# Patient Record
Sex: Female | Born: 1940 | Race: White | Hispanic: No | Marital: Married | Smoking: Never smoker
Health system: Southern US, Community
[De-identification: ages and names within clinical notes are randomized; demographics above are authoritative.]

## PROBLEM LIST (undated history)

## (undated) DIAGNOSIS — F0390 Unspecified dementia without behavioral disturbance: Secondary | ICD-10-CM

## (undated) DIAGNOSIS — K449 Diaphragmatic hernia without obstruction or gangrene: Secondary | ICD-10-CM

## (undated) DIAGNOSIS — D649 Anemia, unspecified: Secondary | ICD-10-CM

## (undated) DIAGNOSIS — E079 Disorder of thyroid, unspecified: Secondary | ICD-10-CM

## (undated) DIAGNOSIS — IMO0001 Reserved for inherently not codable concepts without codable children: Secondary | ICD-10-CM

## (undated) DIAGNOSIS — K219 Gastro-esophageal reflux disease without esophagitis: Secondary | ICD-10-CM

## (undated) DIAGNOSIS — M069 Rheumatoid arthritis, unspecified: Secondary | ICD-10-CM

## (undated) DIAGNOSIS — E785 Hyperlipidemia, unspecified: Secondary | ICD-10-CM

## (undated) DIAGNOSIS — R945 Abnormal results of liver function studies: Secondary | ICD-10-CM

## (undated) DIAGNOSIS — Z79899 Other long term (current) drug therapy: Secondary | ICD-10-CM

## (undated) DIAGNOSIS — N179 Acute kidney failure, unspecified: Secondary | ICD-10-CM

## (undated) DIAGNOSIS — I10 Essential (primary) hypertension: Secondary | ICD-10-CM

## (undated) DIAGNOSIS — R7989 Other specified abnormal findings of blood chemistry: Secondary | ICD-10-CM

## (undated) DIAGNOSIS — K529 Noninfective gastroenteritis and colitis, unspecified: Secondary | ICD-10-CM

## (undated) DIAGNOSIS — M1990 Unspecified osteoarthritis, unspecified site: Secondary | ICD-10-CM

## (undated) DIAGNOSIS — K2980 Duodenitis without bleeding: Secondary | ICD-10-CM

## (undated) DIAGNOSIS — Z299 Encounter for prophylactic measures, unspecified: Secondary | ICD-10-CM

## (undated) DIAGNOSIS — E119 Type 2 diabetes mellitus without complications: Secondary | ICD-10-CM

## (undated) DIAGNOSIS — K222 Esophageal obstruction: Secondary | ICD-10-CM

## (undated) DIAGNOSIS — R413 Other amnesia: Secondary | ICD-10-CM

## (undated) HISTORY — DX: Reserved for inherently not codable concepts without codable children: IMO0001

## (undated) HISTORY — DX: Disorder of thyroid, unspecified: E07.9

## (undated) HISTORY — DX: Esophageal obstruction: K22.2

## (undated) HISTORY — PX: DG GALL BLADDER: HXRAD326

## (undated) HISTORY — DX: Essential (primary) hypertension: I10

## (undated) HISTORY — DX: Hyperlipidemia, unspecified: E78.5

## (undated) HISTORY — DX: Other long term (current) drug therapy: Z79.899

## (undated) HISTORY — DX: Encounter for prophylactic measures, unspecified: Z29.9

## (undated) HISTORY — DX: Type 2 diabetes mellitus without complications: E11.9

## (undated) HISTORY — DX: Duodenitis without bleeding: K29.80

## (undated) HISTORY — DX: Unspecified osteoarthritis, unspecified site: M19.90

## (undated) HISTORY — DX: Diaphragmatic hernia without obstruction or gangrene: K44.9

## (undated) HISTORY — DX: Acute kidney failure, unspecified: N17.9

## (undated) HISTORY — DX: Gastro-esophageal reflux disease without esophagitis: K21.9

---

## 1999-02-19 ENCOUNTER — Encounter (INDEPENDENT_AMBULATORY_CARE_PROVIDER_SITE_OTHER): Payer: Self-pay | Admitting: Specialist

## 1999-02-19 ENCOUNTER — Ambulatory Visit (HOSPITAL_COMMUNITY): Admission: RE | Admit: 1999-02-19 | Discharge: 1999-02-19 | Payer: Self-pay | Admitting: *Deleted

## 1999-11-18 ENCOUNTER — Encounter: Payer: Self-pay | Admitting: Family Medicine

## 1999-11-18 ENCOUNTER — Encounter: Admission: RE | Admit: 1999-11-18 | Discharge: 1999-11-18 | Payer: Self-pay | Admitting: Family Medicine

## 1999-11-23 ENCOUNTER — Ambulatory Visit (HOSPITAL_COMMUNITY): Admission: RE | Admit: 1999-11-23 | Discharge: 1999-11-23 | Payer: Self-pay

## 2000-11-20 ENCOUNTER — Encounter: Admission: RE | Admit: 2000-11-20 | Discharge: 2000-11-20 | Payer: Self-pay | Admitting: Family Medicine

## 2000-11-20 ENCOUNTER — Encounter: Payer: Self-pay | Admitting: Family Medicine

## 2001-11-02 DIAGNOSIS — E042 Nontoxic multinodular goiter: Secondary | ICD-10-CM

## 2001-11-02 HISTORY — DX: Nontoxic multinodular goiter: E04.2

## 2001-11-27 ENCOUNTER — Encounter: Payer: Self-pay | Admitting: Family Medicine

## 2001-11-27 ENCOUNTER — Encounter: Admission: RE | Admit: 2001-11-27 | Discharge: 2001-11-27 | Payer: Self-pay | Admitting: Family Medicine

## 2001-11-29 ENCOUNTER — Encounter: Payer: Self-pay | Admitting: Endocrinology

## 2001-11-29 ENCOUNTER — Ambulatory Visit (HOSPITAL_COMMUNITY): Admission: RE | Admit: 2001-11-29 | Discharge: 2001-11-29 | Payer: Self-pay | Admitting: Endocrinology

## 2002-11-29 ENCOUNTER — Encounter: Payer: Self-pay | Admitting: Family Medicine

## 2002-11-29 ENCOUNTER — Encounter: Admission: RE | Admit: 2002-11-29 | Discharge: 2002-11-29 | Payer: Self-pay | Admitting: Family Medicine

## 2004-09-15 ENCOUNTER — Encounter: Admission: RE | Admit: 2004-09-15 | Discharge: 2004-09-15 | Payer: Self-pay | Admitting: Family Medicine

## 2006-01-30 ENCOUNTER — Encounter (INDEPENDENT_AMBULATORY_CARE_PROVIDER_SITE_OTHER): Payer: Self-pay | Admitting: Specialist

## 2006-01-30 ENCOUNTER — Ambulatory Visit (HOSPITAL_COMMUNITY): Admission: RE | Admit: 2006-01-30 | Discharge: 2006-01-30 | Payer: Self-pay | Admitting: *Deleted

## 2007-04-09 ENCOUNTER — Encounter: Admission: RE | Admit: 2007-04-09 | Discharge: 2007-04-09 | Payer: Self-pay | Admitting: Family

## 2007-07-27 ENCOUNTER — Encounter: Admission: RE | Admit: 2007-07-27 | Discharge: 2007-07-27 | Payer: Self-pay

## 2010-08-20 NOTE — Op Note (Signed)
Alejandra Espinoza, Alejandra Espinoza                ACCOUNT NO.:  [id number]   MEDICAL RECORD NO.:  [id number]          PATIENT TYPE:  AMB   LOCATION:  ENDO                         FACILITY:  [facility]   PHYSICIAN:  Georgiana Spinner, M.D.    DATE OF BIRTH:  August 02, 1940   DATE OF PROCEDURE:  01/30/2006  DATE OF DISCHARGE:                                 OPERATIVE REPORT   PROCEDURE:  Upper endoscopy   ANESTHESIA:  Demerol 60 mg, Versed 7.5 mg.   PROCEDURE:  With the patient mildly sedated in the left lateral decubitus  position the Olympus videoscopic endoscope was inserted in the mouth and  passed under direct vision through the esophagus which appeared normal until  we reached distal esophagus and there appeared to be a rim of erythematous  change at the squamocolumnar junction that was photographed and biopsied.  We entered into the stomach and blood was seen in the stomach.  This was  photographed and the erythematous changes of the gastric fundus, body, and  antrum were also noted, photographed, and biopsied. Duodenal bulb, second  portion duodenum appeared normal.  From this point the endoscope was slowly  withdrawn taking circumferential views of duodenal mucosa until the  endoscope had been pulled back into the stomach and placed in retroflexion  to view the stomach from below. The endoscope was then straightened and  withdrawn taking circumferential views remaining gastric and esophageal  mucosa.  The patient's vital signs, pulse oximeter remained stable.  The  patient tolerated procedure well without apparent complications.   FINDINGS:  Diffuse erythematous gastritis changes involving the entire  stomach and changes of erythema of the squamocolumnar junction.  Await  biopsy report.  The patient will call me for results and follow-up with me  as an outpatient.  Proceed to colonoscopy as planned.           ______________________________  Georgiana Spinner, M.D.     GMO/MEDQ  D:  01/30/2006  T:   01/31/2006  Job:  [id number]

## 2010-08-20 NOTE — Op Note (Signed)
Alejandra Espinoza, Alejandra Espinoza                ACCOUNT NO.:  [id number]   MEDICAL RECORD NO.:  [id number]          PATIENT TYPE:  AMB   LOCATION:  ENDO                         FACILITY:  [facility]   PHYSICIAN:  Georgiana Spinner, M.D.    DATE OF BIRTH:  Aug 12, 1940   DATE OF PROCEDURE:  01/30/2006  DATE OF DISCHARGE:                                 OPERATIVE REPORT   PROCEDURE:  Colonoscopy.   INDICATIONS:  Hemoccult positivity.   ANESTHESIA:  Demerol 40, Versed 2.5 mg.   PROCEDURE:  With the patient mildly sedated in the left lateral decubitus  position the Olympus videoscopic colonoscope was inserted into the rectum  and passed under direct vision to the cecum identified by ileocecal valve  and appendiceal orifice both of which were photographed. From this point the  colonoscope was slowly withdrawn taking circumferential views of colonic  mucosa stopping only in the rectum which appeared normal on direct and  showed hemorrhoids on retroflexed view. The endoscope was straightened,  withdrawn.  The patient's vital signs, pulse oximeter remained stable.  The  patient tolerated procedure well without apparent complications.   FINDINGS:  Internal hemorrhoids otherwise unremarkable colonoscopic  examination to cecum.   PLAN:  See endoscopy note for further details.           ______________________________  Georgiana Spinner, M.D.     GMO/MEDQ  D:  01/30/2006  T:  01/31/2006  Job:  [id number]

## 2011-06-23 ENCOUNTER — Other Ambulatory Visit: Payer: Self-pay | Admitting: Gastroenterology

## 2011-06-29 ENCOUNTER — Ambulatory Visit
Admission: RE | Admit: 2011-06-29 | Discharge: 2011-06-29 | Disposition: A | Discharge status: Home / Self Care | Payer: Medicare Other | Source: Ambulatory Visit | Attending: Gastroenterology | Admitting: Gastroenterology

## 2013-05-20 ENCOUNTER — Encounter: Payer: Self-pay | Admitting: Podiatry

## 2013-05-20 ENCOUNTER — Ambulatory Visit (INDEPENDENT_AMBULATORY_CARE_PROVIDER_SITE_OTHER): Payer: Medicare Other | Admitting: Podiatry

## 2013-05-20 VITALS — BP 140/77 | HR 69 | Resp 18

## 2013-05-20 DIAGNOSIS — E1149 Type 2 diabetes mellitus with other diabetic neurological complication: Secondary | ICD-10-CM

## 2013-05-20 DIAGNOSIS — M79609 Pain in unspecified limb: Secondary | ICD-10-CM

## 2013-05-20 MED ORDER — GABAPENTIN 300 MG PO CAPS: 300.0000 mg | ORAL_CAPSULE | Freq: Every day | ORAL | Status: DC

## 2013-05-20 NOTE — Patient Instructions (Signed)
Diabetic Neuropathy Diabetic neuropathy is a nerve disease or nerve damage that is caused by diabetes mellitus. About half of all people with diabetes mellitus have some form of nerve damage. Nerve damage is more common in those who have had diabetes mellitus for many years and who generally have not had good control of their blood sugar (glucose) level. Diabetic neuropathy is a common complication of diabetes mellitus. There are three more common types of diabetic neuropathy and a fourth type that is less common and less understood:   Peripheral neuropathy This is the most common type of diabetic neuropathy. It causes damage to the nerves of the feet and legs first and then eventually the hands and arms.The damage affects the ability to sense touch.  Autonomic neuropathy This type causes damage to the autonomic nervous system, which controls the following functions:  Heartbeat.  Body temperature.  Blood pressure.  Urination.  Digestion.  Sweating.  Sexual function.  Focal neuropathy Focal neuropathy can be painful and unpredictable and occurs most often in older adults with diabetes mellitus. It involves a specific nerve or one area and often comes on suddenly. It usually does not cause long-term problems.  Radiculoplexus neuropathy Sometimes called lumbosacral radiculoplexus neuropathy, radiculoplexus neuropathy affects the nerves of the thighs, hips, buttocks, or legs. It is more common in people with type 2 diabetes mellitus and in older men. It is characterized by debilitating pain, weakness, and atrophy, usually in the thigh muscles. CAUSES  The cause of peripheral, autonomic, and focal neuropathies is diabetes mellitus that is uncontrolled and high glucose levels. The cause of radiculoplexus neuropathy is unknown. However, it is thought to be caused by inflammation related to uncontrolled glucose levels. SIGNS AND SYMPTOMS  Peripheral Neuropathy Peripheral neuropathy develops  slowly over time. When the nerves of the feet and legs no longer work there may be:   Burning, stabbing, or aching pain in the legs or feet.  Inability to feel pressure or pain in your feet. This can lead to:  Thick calluses over pressure areas.  Pressure sores.  Ulcers.  Foot deformities.  Reduced ability to feel temperature changes.  Muscle weakness. Autonomic Neuropathy The symptoms of autonomic neuropathy vary depending on which nerves are affected. Symptoms may include:  Problems with digestion, such as:  Feeling sick to your stomach (nausea).  Vomiting.  Bloating.  Constipation.  Diarrhea.  Abdominal pain.  Difficulty with urination. This occurs if you lose your ability to sense when your bladder is full. Problems include:  Urine leakage (incontinence).  Inability to empty your bladder completely (retention).  Rapid or irregular heartbeat (palpitations).  Blood pressure drops when you stand up (orthostatic hypotension). When you stand up you may feel:  Dizzy.  Weak.  Faint.  In men, inability to attain and maintain an erection.  In women, vaginal dryness and problems with decreased sexual desire and arousal.  Problems with body temperature regulation.  Increased or decreased sweating. Focal Neuropathy  Abnormal eye movements or abnormal alignment of both eyes.  Weakness in the wrist.  Foot drop. This results in an inability to lift the foot properly and abnormal walking or foot movement.  Paralysis on one side of your face (Bell palsy).  Chest or abdominal pain. Radiculoplexus Neuropathy  Sudden, severe pain in your hip, thigh, or buttocks.  Weakness and wasting of thigh muscles.  Difficulty rising from a seated position.  Abdominal swelling.  Unexplained weight loss (usually more than 10 lb [4.5 kg]). DIAGNOSIS  Peripheral Neuropathy   Your senses may be tested. Sensory function testing can be done with:  A light touch using a  monofilament.  A vibration with tuning fork.  A sharp sensation with a pin prick. Other tests that can help diagnose neuropathy are:  Nerve conduction velocity. This test checks the transmission of an electrical current through a nerve.  Electromyography. This shows how muscles respond to electrical signals transmitted by nearby nerves.  Quantitative sensory testing. This is used to assess how your nerves respond to vibrations and changes in temperature. Autonomic Neuropathy Diagnosis is often based on reported symptoms. Tell your health care provider if you experience:   Dizziness.   Constipation.   Diarrhea.   Inappropriate urination or inability to urinate.   Inability to get or maintain an erection.  Tests that may be done include:   Electrocardiography or Holter monitor. These are tests that can help show problems with the heart rate or heart rhythm.   An X-ray exam may be done. Focal Neuropathy Diagnosis is made based on your symptoms and what your health care provider finds during your exam. Other tests may be done. They may include:  Nerve conduction velocities. This checks the transmission of electrical current through a nerve.  Electromyography. This shows how muscles respond to electrical signals transmitted by nearby nerves.  Quantitative sensory testing. This test is used to assess how your nerves respond to vibration and changes in temperature. Radiculoplexus Neuropathy  Often the first thing is to eliminate any other issue or problems that might be the cause, as there is no stick test for diagnosis.  X-ray exam of your spine and lumbar region.  Spinal tap to rule out cancer.  MRI to rule out other lesions. TREATMENT  Once nerve damage occurs, it cannot be reversed. The goal of treatment is to keep the disease or nerve damage from getting worse and affecting more nerve fibers. Controlling your blood glucose level is the key. Most people with  radiculoplexus neuropathy see at least a partial improvement over time. You will need to keep your blood glucose and HbA1c levels in the target range determined by your health care provider. Things that help control blood glucose levels include:   Blood glucose monitoring.   Meal planning.   Physical activity.   Diabetes medicine.  Over time, maintaining lower blood glucose levels helps lessen symptoms. Sometimes, prescription pain medicine is needed. HOME CARE INSTRUCTIONS:  Do not smoke.  Keep your blood glucose level in the range that you and your health care provider have determined acceptable for you.  Keep your blood pressure level in the range that you and your health care provider have determined acceptable for you.  Eat a well-balanced diet.  Be active every day.  Check your feet every day. SEEK MEDICAL CARE IF:   You have burning, stabbing, or aching pain in the legs or feet.  You are unable to feel pressure or pain in your feet.  You develop problems with digestion such as:  Nausea.  Vomiting.  Bloating.  Constipation.  Diarrhea.  Abdominal pain.  You have difficulty with urination, such as:  Incontinence.  Retention.  You have palpitations.  You develop orthostatic hypotension. When you stand up you may feel:  Dizzy.  Weak.  Faint.  You cannot attain and maintain an erection (in men).  You have vaginal dryness and problems with decreased sexual desire and arousal (in women).  You have severe pain in your thighs, legs, or buttocks.  You have   unexplained weight loss. Document Released: 05/30/2001 Document Revised: 01/09/2013 Document Reviewed: 08/30/2012 [employer] Patient Information 2014 [employer].  

## 2013-05-20 NOTE — Progress Notes (Signed)
   Subjective:    Patient ID: Alejandra BaizeLinda B Espinoza, female    DOB: 1941/02/11, 73 y.o.   MRN: [id number]  HPI both my feet sting and get numbness and sore and tender and I am diabetic and have been for about 15 years or longer and some swelling and right foot is the worse. Symptoms have been present for approximately 5 years and worsening over the last 2 years. She describes difficulty sleeping at night because of unpleasant feelings in her feet.    Review of Systems  HENT: Positive for sinus pressure.   Cardiovascular:       Calf pain with walking  Gastrointestinal: Positive for constipation.  Endocrine: Positive for cold intolerance.  Musculoskeletal: Positive for gait problem.       Joint and muscle pain  Neurological: Positive for numbness.  Hematological: Bruises/bleeds easily.       Slow to heal  Psychiatric/Behavioral: The patient is nervous/anxious.   All other systems reviewed and are negative.       Objective:   Physical Exam  Orientated x3 white female  Vascular: The DP and PT pulses are 2/4 bilaterally  Neurological: Sensation detained in monofilament wire intact 10/10 bilaterally. Needle and ankle reflexes are reactive bilaterally. Vibratory sensation diminished mildly on left.  Dermatological: atrophic skin without any hair growth noted. Elongated incurvated toenails x10  Musculoskeletal: Left HAV. No restriction ankle subtalar midtarsal joints.        Assessment & Plan:   Assessment: Diabetic peripheral neuropathy with associated pain.  Plan: Rx Neurontin 300 mg #30 one at bedtime. Reevaluate x30 days

## 2013-06-17 ENCOUNTER — Ambulatory Visit (INDEPENDENT_AMBULATORY_CARE_PROVIDER_SITE_OTHER): Payer: BC Managed Care – PPO | Admitting: Podiatry

## 2013-06-17 ENCOUNTER — Encounter: Payer: Self-pay | Admitting: Podiatry

## 2013-06-17 VITALS — BP 145/70 | HR 77 | Resp 18

## 2013-06-17 DIAGNOSIS — M79609 Pain in unspecified limb: Secondary | ICD-10-CM

## 2013-06-17 DIAGNOSIS — E1149 Type 2 diabetes mellitus with other diabetic neurological complication: Secondary | ICD-10-CM

## 2013-06-17 MED ORDER — GABAPENTIN 300 MG PO CAPS: 300.0000 mg | ORAL_CAPSULE | Freq: Every day | ORAL | Status: DC

## 2013-06-17 MED ORDER — GABAPENTIN 300 MG PO CAPS: 300.0000 mg | ORAL_CAPSULE | Freq: Two times a day (BID) | ORAL | Status: DC

## 2013-06-17 NOTE — Patient Instructions (Signed)
Take 1 gabapentin twice a day. Recheck x30 days

## 2013-06-18 NOTE — Progress Notes (Signed)
Patient ID: Alejandra BaizeLinda B Watling, female   DOB: 1940-04-11, 73 y.o.   MRN: [id number] Subjective: Patient presents for followup visit of 05/20/2013 and has been taking gabapentin 300 mg at bedtime she's describes some mild relief from her painful feet however she still quite uncomfortable. She denies any side effects from the medication including edema rashes.  Objective orientated [id number] year old white female patient  Assessment persistence of diabetic peripheral neuropathy bilaterally Satisfactory tolerance of low-dose gabapentin  Plan: Increase gabapentin 300 mg 2 twice a day x30 days. Reappoint x30 days

## 2013-07-15 ENCOUNTER — Ambulatory Visit: Payer: Medicare Other | Admitting: Podiatry

## 2014-02-03 ENCOUNTER — Encounter: Payer: Self-pay | Admitting: Podiatry

## 2014-02-03 ENCOUNTER — Ambulatory Visit (INDEPENDENT_AMBULATORY_CARE_PROVIDER_SITE_OTHER): Payer: Medicare Other | Admitting: Podiatry

## 2014-02-03 VITALS — BP 159/79 | HR 67 | Temp 97.6°F | Resp 16 | Ht 65.0 in | Wt 189.0 lb

## 2014-02-03 DIAGNOSIS — E11621 Type 2 diabetes mellitus with foot ulcer: Secondary | ICD-10-CM

## 2014-02-03 DIAGNOSIS — L97409 Non-pressure chronic ulcer of unspecified heel and midfoot with unspecified severity: Secondary | ICD-10-CM

## 2014-02-03 MED ORDER — SILVER SULFADIAZINE 1 % EX CREA: 1.0000 "application " | TOPICAL_CREAM | Freq: Every day | CUTANEOUS | Status: DC

## 2014-02-03 MED ORDER — CEPHALEXIN 500 MG PO CAPS: 500.0000 mg | ORAL_CAPSULE | Freq: Four times a day (QID) | ORAL | Status: DC

## 2014-02-03 NOTE — Progress Notes (Signed)
   Subjective:    Patient ID: Alejandra BaizeLinda B Espinoza, female    DOB: [birth year], 73 y.o.   MRN: [id number]  HPI Comments: N diabetic ulcers L B/L posterior heels, right > left D on and off for 2 - 3 month C scabbing and peeling A diabetes, pt states she either sits or lies down  T Neosporin only      Review of Systems  Musculoskeletal:       Fractured tailbone 3 weeks ago.  All other systems reviewed and are negative.      Objective:   Physical Exam  Orientated 3  Vascular: DP pulses 2/4 bilaterally PT pulses 2/4 bilaterally  Neurological: Sensation to 10 g monofilament wire intact 5/5 bilaterally Vibratory sensation intact bilaterally Ankle reflexes equal and reactive bilaterally  Dermatological: Posterior left heel as a dry eschar 3 cm in diameter. No erythema, edema or drainage noted surrounding this area.  Posterior lateral right heel has a  Eschar 2.5 cm x 1 cm with the central portion having a superficial ulcer with a granular base with low-grade erythema without active drainage or increased warmth or malodor      Assessment & Plan:   Assessment: Superficial ulceration right lateral heel with low-grade cellulitis  Stable eschar left heel without any clinical sign of infection  Patient is is sedentary and sits with her legs in a externally rotated position on a continuous basis placing excessive weight on the  heel areas  Plan: The superficial ulcer in the right heel was debrided and dressed with Silvadene Cephalexin 500 mg 4 times a day 10 days prescribed  Patient advised to apply Silvadene daily to the posterior right heel and cover with gauze Leg cradles recommended bilaterally  Reappoint 7 days

## 2014-02-03 NOTE — Patient Instructions (Addendum)
Either use pillows behind your legs or leg cradles to prevent pressure on your right and left heels  Gave  patient a prescription for leg cradles [facility] Apply Silvadene cream daily to the skin ulcer in your right heel and cover with gauze Begin oral antibiotics cephalexin 500 mg 1 capsule 4 times a day

## 2014-02-04 ENCOUNTER — Other Ambulatory Visit: Payer: Self-pay | Admitting: Podiatry

## 2014-02-04 ENCOUNTER — Encounter: Payer: Self-pay | Admitting: Podiatry

## 2014-02-06 ENCOUNTER — Ambulatory Visit: Payer: Medicare Other | Admitting: Podiatry

## 2014-02-10 ENCOUNTER — Encounter: Payer: Self-pay | Admitting: Podiatry

## 2014-02-10 ENCOUNTER — Ambulatory Visit (INDEPENDENT_AMBULATORY_CARE_PROVIDER_SITE_OTHER): Payer: BC Managed Care – PPO | Admitting: Podiatry

## 2014-02-10 VITALS — BP 128/71 | HR 75 | Temp 98.0°F | Resp 14

## 2014-02-10 DIAGNOSIS — L97409 Non-pressure chronic ulcer of unspecified heel and midfoot with unspecified severity: Secondary | ICD-10-CM

## 2014-02-10 DIAGNOSIS — E11621 Type 2 diabetes mellitus with foot ulcer: Secondary | ICD-10-CM

## 2014-02-10 NOTE — Patient Instructions (Signed)
Apply Silvadene cream to the skin ulcers on the right and left heels daily and cover with gauze Wear leg cradle right Place spell oh underneath left leg Limit standing walking

## 2014-02-11 NOTE — Progress Notes (Signed)
Patient ID: Alejandra BaizeLinda B Salls, female   DOB: [birth year], 73 y.o.   MRN: [id number]  Subjective: Patient presents for follow-up care after the visit of 02/03/2014 for bilateral posterior skin ulcers. Patient using a leg cradle on the right Currently taking cephalexin from visit of 02/03/2014 without a complaint  Objective: Eschar posterior right heel after debridement breaks down to 15 x 20 mm superficial ulcer with red granular base with slight serous drainage noted. There is no erythema, edema surrounding the wound site  Eschar posterior lateral left heel breaks down into superficial ulceration with granular base measuring 15 x 10 mm without any erythema, edema or drainage noted.  Assessment: Noninfected skin ulcer posterior lateral heels right and left  Plan: The eschar and posterior heel ulcers were debrided and dressed with Silvadene Patient will apply Silvadene and gauze dressing to ulcers daily Complete any remaining cephalexin Use pillow on left leg or purchase an additional leg cradle for left  Reappoint 7 days

## 2014-02-17 ENCOUNTER — Encounter: Payer: Self-pay | Admitting: Podiatry

## 2014-02-17 ENCOUNTER — Ambulatory Visit (INDEPENDENT_AMBULATORY_CARE_PROVIDER_SITE_OTHER): Payer: BC Managed Care – PPO | Admitting: Podiatry

## 2014-02-17 VITALS — BP 157/90 | HR 91 | Temp 97.6°F | Resp 13

## 2014-02-17 DIAGNOSIS — L97409 Non-pressure chronic ulcer of unspecified heel and midfoot with unspecified severity: Secondary | ICD-10-CM

## 2014-02-17 DIAGNOSIS — E11621 Type 2 diabetes mellitus with foot ulcer: Secondary | ICD-10-CM

## 2014-02-17 NOTE — Patient Instructions (Signed)
Continue to apply Silvadene cream and gauze dressings to the right and left heels daily. Attach gauze with paper tape  Use the leg cradles when you sleep at night

## 2014-02-18 NOTE — Progress Notes (Signed)
Patient ID: Alejandra BaizeLinda B Stark, female   DOB: [birth year], 73 y.o.   MRN: [id number]  Subjective: This patient presents for bilateral diabetic heel ulcers under our care since 02/03/2014. Patient has completed cephalexin without a complaint from medication. Patient is using leg cradle on the right only. She is confused as to whether to use the leg cradle when she sleeps and apparently does not use leg cradle when sleeping.  Objective: Right foot Lateral right heel is skin ulcer with eschar in granular base measuring 12 x 5 mm. There is no active erythema, edema surrounding the skin ulcer  Left foot Lateral left heel as eschar with 5 x 20 mm superficial ulcer with a moist fibrous base. No erythema, edema surrounds the ulcer site  Assessment: Noninfected skin ulcers bilaterally  Plan: Sharp debride plantar skin ulcer left patient's tolerance Sharp debride plantar skin ulcer right Apply Silvadene dressings right and left  Advised patient to use leg cradles when sitting and sleeping is much as possible Prescribed leg cradle for patient to wear on the right leg for [facility].  Patient will continue apply Silvadene cream and gauze dressings to heel ulcers right and left Advised to use leg cradles right and left including sleeping  Reappoint 7 days

## 2014-02-24 ENCOUNTER — Ambulatory Visit: Payer: BC Managed Care – PPO | Admitting: Podiatry

## 2014-06-04 ENCOUNTER — Emergency Department (HOSPITAL_COMMUNITY): Payer: Medicare Other

## 2014-06-04 ENCOUNTER — Observation Stay (HOSPITAL_COMMUNITY)
Admission: EM | Admit: 2014-06-04 | Discharge: 2014-06-06 | Disposition: A | Discharge status: Home / Self Care | Payer: Medicare Other | Attending: Internal Medicine | Admitting: Internal Medicine

## 2014-06-04 ENCOUNTER — Encounter (HOSPITAL_COMMUNITY): Payer: Self-pay

## 2014-06-04 DIAGNOSIS — I10 Essential (primary) hypertension: Secondary | ICD-10-CM | POA: Diagnosis not present

## 2014-06-04 DIAGNOSIS — R55 Syncope and collapse: Principal | ICD-10-CM | POA: Insufficient documentation

## 2014-06-04 DIAGNOSIS — W1839XA Other fall on same level, initial encounter: Secondary | ICD-10-CM | POA: Diagnosis not present

## 2014-06-04 DIAGNOSIS — R4182 Altered mental status, unspecified: Secondary | ICD-10-CM | POA: Diagnosis present

## 2014-06-04 DIAGNOSIS — Y929 Unspecified place or not applicable: Secondary | ICD-10-CM | POA: Diagnosis not present

## 2014-06-04 DIAGNOSIS — Y939 Activity, unspecified: Secondary | ICD-10-CM | POA: Insufficient documentation

## 2014-06-04 DIAGNOSIS — R569 Unspecified convulsions: Secondary | ICD-10-CM | POA: Diagnosis not present

## 2014-06-04 DIAGNOSIS — M1990 Unspecified osteoarthritis, unspecified site: Secondary | ICD-10-CM | POA: Insufficient documentation

## 2014-06-04 DIAGNOSIS — Y999 Unspecified external cause status: Secondary | ICD-10-CM | POA: Diagnosis not present

## 2014-06-04 DIAGNOSIS — E079 Disorder of thyroid, unspecified: Secondary | ICD-10-CM | POA: Insufficient documentation

## 2014-06-04 DIAGNOSIS — E119 Type 2 diabetes mellitus without complications: Secondary | ICD-10-CM | POA: Diagnosis not present

## 2014-06-04 DIAGNOSIS — Z792 Long term (current) use of antibiotics: Secondary | ICD-10-CM | POA: Insufficient documentation

## 2014-06-04 DIAGNOSIS — E785 Hyperlipidemia, unspecified: Secondary | ICD-10-CM | POA: Diagnosis not present

## 2014-06-04 DIAGNOSIS — N179 Acute kidney failure, unspecified: Secondary | ICD-10-CM | POA: Diagnosis present

## 2014-06-04 DIAGNOSIS — Z043 Encounter for examination and observation following other accident: Secondary | ICD-10-CM | POA: Diagnosis not present

## 2014-06-04 DIAGNOSIS — Z79899 Other long term (current) drug therapy: Secondary | ICD-10-CM | POA: Insufficient documentation

## 2014-06-04 LAB — URINALYSIS, ROUTINE W REFLEX MICROSCOPIC
BILIRUBIN URINE: NEGATIVE
Glucose, UA: 500 mg/dL — AB
Hgb urine dipstick: NEGATIVE
KETONES UR: NEGATIVE mg/dL
NITRITE: NEGATIVE
PROTEIN: NEGATIVE mg/dL
Specific Gravity, Urine: 1.013 (ref 1.005–1.030)
Urobilinogen, UA: 1 mg/dL (ref 0.0–1.0)
pH: 6 (ref 5.0–8.0)

## 2014-06-04 LAB — LACTIC ACID, PLASMA: Lactic Acid, Venous: 1.2 mmol/L (ref 0.5–2.0)

## 2014-06-04 LAB — TSH
TSH: 0.14 u[IU]/mL — ABNORMAL LOW (ref 0.350–4.500)
TSH: 0.151 u[IU]/mL — ABNORMAL LOW (ref 0.350–4.500)

## 2014-06-04 LAB — COMPREHENSIVE METABOLIC PANEL
ALBUMIN: 3.6 g/dL (ref 3.5–5.2)
ALT: 12 U/L (ref 0–35)
AST: 16 U/L (ref 0–37)
Alkaline Phosphatase: 54 U/L (ref 39–117)
Anion gap: 8 (ref 5–15)
BUN: 29 mg/dL — ABNORMAL HIGH (ref 6–23)
CALCIUM: 9.2 mg/dL (ref 8.4–10.5)
CO2: 27 mmol/L (ref 19–32)
Chloride: 100 mmol/L (ref 96–112)
Creatinine, Ser: 1.99 mg/dL — ABNORMAL HIGH (ref 0.50–1.10)
GFR, EST AFRICAN AMERICAN: 28 mL/min — AB (ref >90)
GFR, EST NON AFRICAN AMERICAN: 24 mL/min — AB (ref >90)
Glucose, Bld: 303 mg/dL — ABNORMAL HIGH (ref 70–99)
POTASSIUM: 5.7 mmol/L — AB (ref 3.5–5.1)
SODIUM: 135 mmol/L (ref 135–145)
TOTAL PROTEIN: 7.7 g/dL (ref 6.0–8.3)
Total Bilirubin: 0.8 mg/dL (ref 0.3–1.2)

## 2014-06-04 LAB — DIFFERENTIAL
Basophils Absolute: 0 10*3/uL (ref 0.0–0.1)
Basophils Relative: 0 % (ref 0–1)
Eosinophils Absolute: 0.1 10*3/uL (ref 0.0–0.7)
Eosinophils Relative: 1 % (ref 0–5)
LYMPHS ABS: 1.3 10*3/uL (ref 0.7–4.0)
Lymphocytes Relative: 14 % (ref 12–46)
Monocytes Absolute: 0.4 10*3/uL (ref 0.1–1.0)
Monocytes Relative: 5 % (ref 3–12)
NEUTROS PCT: 80 % — AB (ref 43–77)
Neutro Abs: 7.2 10*3/uL (ref 1.7–7.7)

## 2014-06-04 LAB — CK: Total CK: 321 U/L — ABNORMAL HIGH (ref 7–177)

## 2014-06-04 LAB — CBC
HCT: 37.9 % (ref 36.0–46.0)
HEMOGLOBIN: 12.5 g/dL (ref 12.0–15.0)
MCH: 31.1 pg (ref 26.0–34.0)
MCHC: 33 g/dL (ref 30.0–36.0)
MCV: 94.3 fL (ref 78.0–100.0)
PLATELETS: 214 10*3/uL (ref 150–400)
RBC: 4.02 MIL/uL (ref 3.87–5.11)
RDW: 12.8 % (ref 11.5–15.5)
WBC: 8.7 10*3/uL (ref 4.0–10.5)

## 2014-06-04 LAB — PROTIME-INR
INR: 1.03 (ref 0.00–1.49)
PROTHROMBIN TIME: 13.6 s (ref 11.6–15.2)

## 2014-06-04 LAB — URINE MICROSCOPIC-ADD ON

## 2014-06-04 LAB — AMMONIA

## 2014-06-04 LAB — CBG MONITORING, ED
Glucose-Capillary: 301 mg/dL — ABNORMAL HIGH (ref 70–99)
Glucose-Capillary: 114 mg/dL — ABNORMAL HIGH (ref 70–99)

## 2014-06-04 LAB — GLUCOSE, CAPILLARY: Glucose-Capillary: 305 mg/dL — ABNORMAL HIGH (ref 70–99)

## 2014-06-04 LAB — APTT: APTT: 27 s (ref 24–37)

## 2014-06-04 MED ORDER — ACETAMINOPHEN 650 MG RE SUPP: 650.0000 mg | Freq: Four times a day (QID) | RECTAL | Status: DC | PRN

## 2014-06-04 MED ORDER — PANTOPRAZOLE SODIUM 40 MG PO TBEC
40.0000 mg | DELAYED_RELEASE_TABLET | Freq: Every day | ORAL | Status: DC
  Administered 2014-06-05 – 2014-06-06 (×2): 40 mg via ORAL
  Filled 2014-06-04 (×2): qty 1

## 2014-06-04 MED ORDER — HYDROMORPHONE HCL 1 MG/ML IJ SOLN
1.0000 mg | INTRAMUSCULAR | Status: DC | PRN
  Administered 2014-06-04 – 2014-06-05 (×3): 1 mg via INTRAVENOUS
  Filled 2014-06-04 (×3): qty 1

## 2014-06-04 MED ORDER — INSULIN ASPART 100 UNIT/ML ~~LOC~~ SOLN
0.0000 [IU] | Freq: Every day | SUBCUTANEOUS | Status: DC
  Administered 2014-06-04: 4 [IU] via SUBCUTANEOUS

## 2014-06-04 MED ORDER — ONDANSETRON HCL 4 MG PO TABS: 4.0000 mg | ORAL_TABLET | Freq: Four times a day (QID) | ORAL | Status: DC | PRN

## 2014-06-04 MED ORDER — FERROUS SULFATE 325 (65 FE) MG PO TABS
325.0000 mg | ORAL_TABLET | Freq: Every day | ORAL | Status: DC
  Administered 2014-06-05 – 2014-06-06 (×2): 325 mg via ORAL
  Filled 2014-06-04 (×2): qty 1

## 2014-06-04 MED ORDER — MORPHINE SULFATE 4 MG/ML IJ SOLN
4.0000 mg | Freq: Once | INTRAMUSCULAR | Status: AC
  Administered 2014-06-04: 4 mg via INTRAVENOUS
  Filled 2014-06-04: qty 1

## 2014-06-04 MED ORDER — PROPYLTHIOURACIL 50 MG PO TABS
50.0000 mg | ORAL_TABLET | Freq: Two times a day (BID) | ORAL | Status: DC
  Administered 2014-06-04 – 2014-06-06 (×4): 50 mg via ORAL
  Filled 2014-06-04 (×5): qty 1

## 2014-06-04 MED ORDER — ZOLPIDEM TARTRATE 5 MG PO TABS
5.0000 mg | ORAL_TABLET | Freq: Every evening | ORAL | Status: DC | PRN
  Administered 2014-06-06: 5 mg via ORAL
  Filled 2014-06-04 (×2): qty 1

## 2014-06-04 MED ORDER — INSULIN ASPART 100 UNIT/ML ~~LOC~~ SOLN
6.0000 [IU] | Freq: Once | SUBCUTANEOUS | Status: AC
  Administered 2014-06-04: 6 [IU] via INTRAVENOUS
  Filled 2014-06-04: qty 1

## 2014-06-04 MED ORDER — ACETAMINOPHEN 325 MG PO TABS: 650.0000 mg | ORAL_TABLET | Freq: Four times a day (QID) | ORAL | Status: DC | PRN

## 2014-06-04 MED ORDER — PREGABALIN 50 MG PO CAPS
50.0000 mg | ORAL_CAPSULE | Freq: Two times a day (BID) | ORAL | Status: DC
  Administered 2014-06-04 – 2014-06-06 (×4): 50 mg via ORAL
  Filled 2014-06-04 (×4): qty 1

## 2014-06-04 MED ORDER — HYDROCODONE-ACETAMINOPHEN 5-325 MG PO TABS
1.0000 | ORAL_TABLET | ORAL | Status: DC | PRN
  Administered 2014-06-05 – 2014-06-06 (×4): 2 via ORAL
  Filled 2014-06-04 (×4): qty 2

## 2014-06-04 MED ORDER — ENOXAPARIN SODIUM 30 MG/0.3ML ~~LOC~~ SOLN
30.0000 mg | SUBCUTANEOUS | Status: DC
  Administered 2014-06-04: 30 mg via SUBCUTANEOUS
  Filled 2014-06-04: qty 0.3

## 2014-06-04 MED ORDER — DIPHENHYDRAMINE HCL (SLEEP) 25 MG PO TABS: 25.0000 mg | ORAL_TABLET | Freq: Every evening | ORAL | Status: DC | PRN

## 2014-06-04 MED ORDER — SODIUM CHLORIDE 0.9 % IJ SOLN
3.0000 mL | Freq: Two times a day (BID) | INTRAMUSCULAR | Status: DC
  Administered 2014-06-04 – 2014-06-06 (×2): 3 mL via INTRAVENOUS

## 2014-06-04 MED ORDER — INSULIN ASPART 100 UNIT/ML ~~LOC~~ SOLN
0.0000 [IU] | Freq: Three times a day (TID) | SUBCUTANEOUS | Status: DC
  Administered 2014-06-05 (×2): 5 [IU] via SUBCUTANEOUS
  Administered 2014-06-05: 3 [IU] via SUBCUTANEOUS
  Administered 2014-06-06 (×2): 2 [IU] via SUBCUTANEOUS

## 2014-06-04 MED ORDER — SODIUM CHLORIDE 0.9 % IV SOLN
INTRAVENOUS | Status: DC
  Administered 2014-06-04 – 2014-06-05 (×2): via INTRAVENOUS

## 2014-06-04 MED ORDER — ALUM & MAG HYDROXIDE-SIMETH 200-200-20 MG/5ML PO SUSP: 30.0000 mL | Freq: Four times a day (QID) | ORAL | Status: DC | PRN

## 2014-06-04 MED ORDER — LORAZEPAM 0.5 MG PO TABS
0.5000 mg | ORAL_TABLET | Freq: Four times a day (QID) | ORAL | Status: DC | PRN
Start: 2014-06-04 — End: 2014-06-06

## 2014-06-04 MED ORDER — GABAPENTIN 300 MG PO CAPS: 300.0000 mg | ORAL_CAPSULE | Freq: Two times a day (BID) | ORAL | Status: DC

## 2014-06-04 MED ORDER — ONDANSETRON HCL 4 MG/2ML IJ SOLN: 4.0000 mg | Freq: Four times a day (QID) | INTRAMUSCULAR | Status: DC | PRN

## 2014-06-04 MED ORDER — SODIUM CHLORIDE 0.9 % IV SOLN: INTRAVENOUS | Status: DC

## 2014-06-04 NOTE — H&P (Signed)
History and Physical       Hospital Admission Note Date: 06/04/2014  Patient name: Alejandra Espinoza Medical record number: [id number] Date of birth: 04/03/1941 Age: 74 y.o. Gender: female  PCP: Michiel Sites, MD    Chief Complaint:  Seizure-like activity  HPI: Patient is a 74 year old female with history of osteoarthritis, diabetes mellitus, hypertension, hyperlipidemia, hyperthyroidism presented with seizure-like activity today morning. History was obtained from the patient and her daughter-in-law in the room. Patient remembers trying to get her dog out in the backyard, opening the door and then fell. Her daughter-in-law who witnessed this reported that patient was having sudden jerking movement in her body then she fell backwards. Patient hit the back of her head and sacrum. Patient has been forgetful in the last few months and over the last few days have been having some twitching in her arms. In the ER note jerking movements or seizures were noticed. While going through the med list with patient and her family, patient was noticed to be forgetful about her medications, her daughter-in-law reported that patient has not been taking the medications as prescribed. Patient is supposed to be on OxyContin 60 mg as needed twice a day, patient cannot remember the last time she took it.     Review of Systems:  Constitutional: Denies fever, chills, diaphoresis, poor appetite and fatigue.  HEENT: Denies photophobia, eye pain, redness, hearing loss, ear pain, congestion, sore throat, rhinorrhea, sneezing, mouth sores, trouble swallowing, neck pain, neck stiffness and tinnitus.   Respiratory: Denies SOB, DOE, cough, chest tightness,  and wheezing.   Cardiovascular: Denies chest pain, palpitations and leg swelling.  Gastrointestinal: Denies nausea, vomiting, abdominal pain, diarrhea, constipation, blood in stool and abdominal distention.   Genitourinary: Denies dysuria, urgency, frequency, hematuria, flank pain and difficulty urinating.  Musculoskeletal: Denies myalgias, back pain, joint swelling, arthralgias and gait problem.  Skin: Denies pallor, rash and wound.  Neurological: Please see history of present illness Hematological: Denies adenopathy. Easy bruising, personal or family bleeding history  Psychiatric/Behavioral: Denies suicidal ideation, mood changes, confusion, nervousness, sleep disturbance and agitation  Past Medical History: Past Medical History  Diagnosis Date  . Arthritis   . Diabetes mellitus without complication   . Hyperlipidemia   . Hypertension   . Thyroid disease    History reviewed. No pertinent past surgical history.  Medications: Prior to Admission medications   Medication Sig Start Date End Date Taking? Authorizing Provider  cephALEXin (KEFLEX) 500 MG capsule Take 1 capsule (500 mg total) by mouth 4 (four) times daily. Patient not taking: Reported on 06/04/2014 02/03/14   Santiago Bumpers, DPM  diphenhydrAMINE (SOMINEX) 25 MG tablet Take 25 mg by mouth at bedtime as needed for allergies.   Yes Historical Provider, MD  ferrous sulfate 325 (65 FE) MG tablet Take 325 mg by mouth daily with breakfast.   Yes Historical Provider, MD  gabapentin (NEURONTIN) 300 MG capsule TAKE 1 CAPSULE (300 MG TOTAL) BY MOUTH 2 (TWO) TIMES DAILY. 02/10/14  Yes Richard Tuchman, DPM  hydrochlorothiazide (HYDRODIURIL) 25 MG tablet Take 25 mg by mouth daily.  03/06/13  Yes Historical Provider, MD  lansoprazole (PREVACID) 30 MG capsule Take 30 mg by mouth daily at 12 noon.  05/14/13  Yes Historical Provider, MD  lisinopril (PRINIVIL,ZESTRIL) 40 MG tablet Take 40 mg by mouth daily.  05/14/13  Yes Historical Provider, MD  LORazepam (ATIVAN) 0.5 MG tablet Take 0.5 mg by mouth every 6 (six) hours as needed for anxiety.  03/06/13  Historical Provider, MD  LYRICA 50 MG capsule Take 50 mg by mouth 2 (two) times daily. 05/13/14  Yes  Historical Provider, MD  metFORMIN (GLUCOPHAGE) 1000 MG tablet Take 500 mg by mouth 2 (two) times daily with a meal.  05/06/13  Yes Historical Provider, MD  OXYCONTIN 60 MG T12A Take 60 mg by mouth 2 (two) times daily as needed (for arthritis pain).  04/12/13  Yes Historical Provider, MD  pravastatin (PRAVACHOL) 80 MG tablet Take 40 mg by mouth daily.  03/06/13   Historical Provider, MD  propylthiouracil (PTU) 50 MG tablet Take 50 mg by mouth 2 (two) times daily.   Yes Historical Provider, MD  silver sulfADIAZINE (SILVADENE) 1 % cream Apply 1 application topically daily. 02/03/14   Santiago Bumpers, DPM    Allergies:  No Known Allergies  Social History: Patient reports that she has never smoked. She has never used smokeless tobacco. She reports that she does not drink alcohol or use illicit drugs.   Family History: No family history on file.  Physical Exam: Blood pressure 116/68, pulse 75, temperature 99.2 F (37.3 C), resp. rate 12, height  (1.575 m), SpO2 97 %. General: Alert, awake, oriented x3, in no acute distress. tearful, . HEENT: normocephalic, atraumatic, anicteric sclera, pink conjunctiva, pupils equal and reactive to light and accomodation, oropharynx clear Neck: supple, no masses or lymphadenopathy, no goiter, no bruits  Heart: Regular rate and rhythm, without murmurs, rubs or gallops. Lungs: Clear to auscultation bilaterally, no wheezing, rales or rhonchi. Abdomen: Soft, nontender, nondistended, positive bowel sounds, no masses. Extremities: No clubbing, cyanosis or edema with positive pedal pulses. Neuro: Grossly intact, no focal neurological deficits, strength 5/5 upper and lower extremities bilaterally Psych: alert and oriented x 3, normal mood and affect Skin: no rashes or lesions, warm and dry   LABS on Admission:  Basic Metabolic Panel:  Recent Labs Lab 06/04/14 1325  NA 135  K 5.7*  CL 100  CO2 27  GLUCOSE 303*  BUN 29*  CREATININE 1.99*  CALCIUM 9.2    Liver Function Tests:  Recent Labs Lab 06/04/14 1325  AST 16  ALT 12  ALKPHOS 54  BILITOT 0.8  PROT 7.7  ALBUMIN 3.6   No results for input(s): LIPASE, AMYLASE in the last 168 hours. No results for input(s): AMMONIA in the last 168 hours. CBC:  Recent Labs Lab 06/04/14 1325  WBC 8.7  NEUTROABS 7.2  HGB 12.5  HCT 37.9  MCV 94.3  PLT 214   Cardiac Enzymes: No results for input(s): CKTOTAL, CKMB, CKMBINDEX, TROPONINI in the last 168 hours. BNP: Invalid input(s): POCBNP CBG:  Recent Labs Lab 06/04/14 1327  GLUCAP 301*     Radiological Exams on Admission: Dg Lumbar Spine Complete  06/04/2014   CLINICAL DATA:  Fall, confusion  EXAM: LUMBAR SPINE - COMPLETE 4+ VIEW  COMPARISON:  None  FINDINGS: Five views of the lumbar spine submitted. No acute fracture or subluxation. Mild disc space flattening at L5-S1 level. Mild anterior spurring at T12-L1 and L1-L2 level. Mild anterior spurring upper endplate of L3 vertebral body. Facet degenerative changes at L4 and L5 level.  IMPRESSION: No acute fracture or subluxation. Degenerative changes as described above.   Electronically Signed   By: Natasha Mead M.D.   On: 06/04/2014 15:05   Dg Sacrum/coccyx  06/04/2014   CLINICAL DATA:  Fall, altered mental status  EXAM: SACRUM AND COCCYX - 2+ VIEW  COMPARISON:  01/29/2014  FINDINGS: There is no evidence  of fracture. Pelvic phleboliths are again noted. Surgical clips are noted in right lower quadrant.  IMPRESSION: Negative.   Electronically Signed   By: Natasha MeadLiviu  Pop M.D.   On: 06/04/2014 15:06   Ct Head Wo Contrast  06/04/2014   CLINICAL DATA:  Increasing confusion  EXAM: CT HEAD WITHOUT CONTRAST  TECHNIQUE: Contiguous axial images were obtained from the base of the skull through the vertex without intravenous contrast.  COMPARISON:  None.  FINDINGS: The bony calvarium is intact. No findings to suggest acute hemorrhage, acute infarction or space-occupying mass lesion are noted.  IMPRESSION: No  acute intracranial abnormality is noted.   Electronically Signed   By: Alcide CleverMark  Lukens M.D.   On: 06/04/2014 16:12    Assessment/Plan Principal Problem:   Seizure-like activity: Possibly medication effect from OxyContin withdrawal, patient has been forgetful about all her medications. Patient is on Lyrica and Neurontin. - Admit to neuro telemetry, place on neuro checks, seizure precautions - Obtain CK, lactic acid, TSH - Placed back on Lyrica, Neurontin, neurology consult obtained, obtain EEG - Patient should not be on such high doses of OxyContin and she is forgetful of all her medications, may be taking excessive doses or not taking which could lead to potential withdrawals seizures.   Active Problems:   Diabetes mellitus without complication - Obtain hemoglobin A1c, place on sliding scale insulin Hold metformin    Hyperlipidemia Hold statins for now, check CK to rule out rhabdomyolysis     Hypertension - BP currently stable, hold lisinopril and HCTZ    Acute renal insufficiency - Placed on IV fluid hydration, hold metformin, lisinopril, HCTZ   GERD - Placed on PPI   chronic pain syndrome, arthritis - Placed on oral oxycodone as needed, IV Dilaudid for severe pain. Patient would benefit from pain clinic management for chronic arthritis pain being on high doses of narcotics in light of forgetfulness and dementia. I discussed this with the patient's family to bring it up to their PCPs attention.    DVT prophylaxis:  Lovenox   CODE STATUS:  full code   Family Communication: Admission, patients condition and plan of care including tests being ordered have been discussed with the patient, Husband and Daughter-in-law who indicates understanding and agree with the plan and Code Status   Further plan will depend as patient's clinical course evolves and further radiologic and laboratory data become available.   Time Spent on Admission: 1 hour   Makaia Rappa M.D. [facility] 06/04/2014, 5:22 PM Pager: [phone]  If 7PM-7AM, please contact night-coverage [email or web address] Password [email or web address]

## 2014-06-04 NOTE — ED Provider Notes (Signed)
EKG Interpretation  Date/Time:  Wednesday June 04 2014 17:25:26 EST Ventricular Rate:  87 PR Interval:  149 QRS Duration: 69 QT Interval:  338 QTC Calculation: 407 R Axis:   70 Text Interpretation:  Sinus rhythm No previous ECGs available Confirmed by Manus GunningANCOUR  MD, Robie Mcniel [phone]([id number]) on 06/04/2014 5:29:21 PM        Glynn OctaveStephen Monay Houlton, MD 06/04/14 1729

## 2014-06-04 NOTE — Consult Note (Signed)
Consult Reason for Consult:altered mental status, question new onset seizure Referring Physician: Dr Isidoro Donningai  CC: fall with extremity twitching  HPI: Alejandra Espinoza is an 74 y.o. female hx of DM, HTN presenting with transient episode of altered mental status concerning for new onset seizure. Patient recalls taking dog out and then having a "jerk of my whole body" after which she fell to the ground. Patient denies any LOC. In ED noted to have some extremity jerking movements with no LOC.   Daughter-in-law notes that for the past few days the patient has had some twitching of her extremities along with forgetfulness for the past few months. Of note, she is on both lyrica 50mg  BID and gabapentin 300mg  BID. Patient is unclear why she is on both, notes lyrica is for "foot pain", unclear about gabapentin. Patient is also on oxycontin but it is unclear if she is taking this.   CT head imaging reviewed and was unremarkable. UA positive for possible UTI, BUN and CR mildly elevated,   Past Medical History  Diagnosis Date  . Arthritis   . Diabetes mellitus without complication   . Hyperlipidemia   . Hypertension   . Thyroid disease     History reviewed. No pertinent past surgical history.  No family history on file.  Social History:  reports that she has never smoked. She has never used smokeless tobacco. She reports that she does not drink alcohol or use illicit drugs.  No Known Allergies  Medications:  Scheduled: . enoxaparin (LOVENOX) injection  30 mg Subcutaneous Q24H  . [START ON 06/05/2014] ferrous sulfate  325 mg Oral Q breakfast  . insulin aspart  0-5 Units Subcutaneous QHS  . [START ON 06/05/2014] insulin aspart  0-9 Units Subcutaneous TID WC  . [START ON 06/05/2014] pantoprazole  40 mg Oral Daily  . pregabalin  50 mg Oral BID  . propylthiouracil  50 mg Oral BID  . sodium chloride  3 mL Intravenous Q12H     ROS: Out of a complete 14 system review, the patient complains of only the  following symptoms, and all other reviewed systems are negative.  Physical Examination: Filed Vitals:   06/04/14 1856  BP: 121/50  Pulse: 70  Temp: 98.9 F (37.2 C)  Resp: 16   Physical Exam  Constitutional: He appears well-developed and well-nourished.  Psych: Affect appropriate to situation Eyes: No scleral injection HENT: No OP obstrucion Head: Normocephalic.  Cardiovascular: Normal rate and regular rhythm.  Respiratory: Effort normal and breath sounds normal.  GI: Soft. Bowel sounds are normal. No distension. There is no tenderness.  Skin: WDI  Neurologic Examination Mental Status: Alert, oriented x 2, slightly tangential thought process.  Speech fluent without evidence of aphasia. No dysarthria. Able to follow 3 step commands without difficulty. Cranial Nerves: II: funduscopic exam wnl bilaterally, visual fields grossly normal, pupils equal, round, reactive to light and accommodation III,IV, VI: ptosis not present, extra-ocular motions intact bilaterally V,VII: smile symmetric, facial light touch sensation normal bilaterally VIII: hearing normal bilaterally IX,X: gag reflex present XI: trapezius strength/neck flexion strength normal bilaterally XII: tongue strength normal  Motor: (bilateral asterixis with extension of arms and wrists) Right : Upper extremity    Left:     Upper extremity 5/5 deltoid       5/5 deltoid 5/5 biceps      5/5 biceps  5/5 triceps      5/5 triceps 5/5 hand grip      5/5 hand grip  Lower  extremity     Lower extremity 5/5 hip flexor      5/5 hip flexor 5/5 quadricep      5/5 quadriceps  5/5 hamstrings     5/5 hamstrings 5/5 plantar flexion       5/5 plantar flexion 5/5 plantar extension     5/5 plantar extension Tone and bulk:normal tone throughout; no atrophy noted Sensory: Pinprick and light touch intact throughout, bilaterally. Intact proprioception Deep Tendon Reflexes: 2+ and symmetric throughout Plantars: Right: downgoing   Left:  downgoing Cerebellar: Marked intention tremor with FTN on the right Gait: deferred  Laboratory Studies:   Basic Metabolic Panel:  Recent Labs Lab 06/04/14 1325  NA 135  K 5.7*  CL 100  CO2 27  GLUCOSE 303*  BUN 29*  CREATININE 1.99*  CALCIUM 9.2    Liver Function Tests:  Recent Labs Lab 06/04/14 1325  AST 16  ALT 12  ALKPHOS 54  BILITOT 0.8  PROT 7.7  ALBUMIN 3.6   No results for input(s): LIPASE, AMYLASE in the last 168 hours. No results for input(s): AMMONIA in the last 168 hours.  CBC:  Recent Labs Lab 06/04/14 1325  WBC 8.7  NEUTROABS 7.2  HGB 12.5  HCT 37.9  MCV 94.3  PLT 214    Cardiac Enzymes:  Recent Labs Lab 06/04/14 1724  CKTOTAL 321*    BNP: Invalid input(s): POCBNP  CBG:  Recent Labs Lab 06/04/14 1327 06/04/14 1816  GLUCAP 301* 114*    Microbiology: No results found for this or any previous visit.  Coagulation Studies:  Recent Labs  06/04/14 1325  LABPROT 13.6  INR 1.03    Urinalysis:  Recent Labs Lab 06/04/14 1655  COLORURINE YELLOW  LABSPEC 1.013  PHURINE 6.0  GLUCOSEU 500*  HGBUR NEGATIVE  BILIRUBINUR NEGATIVE  KETONESUR NEGATIVE  PROTEINUR NEGATIVE  UROBILINOGEN 1.0  NITRITE NEGATIVE  LEUKOCYTESUR SMALL*    Lipid Panel:  No results found for: CHOL, TRIG, HDL, CHOLHDL, VLDL, LDLCALC  HgbA1C: No results found for: HGBA1C  Urine Drug Screen:  No results found for: LABOPIA, COCAINSCRNUR, LABBENZ, AMPHETMU, THCU, LABBARB  Alcohol Level: No results for input(s): ETH in the last 168 hours.   Imaging: Dg Lumbar Spine Complete  06/04/2014   CLINICAL DATA:  Fall, confusion  EXAM: LUMBAR SPINE - COMPLETE 4+ VIEW  COMPARISON:  None  FINDINGS: Five views of the lumbar spine submitted. No acute fracture or subluxation. Mild disc space flattening at L5-S1 level. Mild anterior spurring at T12-L1 and L1-L2 level. Mild anterior spurring upper endplate of L3 vertebral body. Facet degenerative changes at L4 and L5  level.  IMPRESSION: No acute fracture or subluxation. Degenerative changes as described above.   Electronically Signed   By: Natasha Mead M.D.   On: 06/04/2014 15:05   Dg Sacrum/coccyx  06/04/2014   CLINICAL DATA:  Fall, altered mental status  EXAM: SACRUM AND COCCYX - 2+ VIEW  COMPARISON:  01/29/2014  FINDINGS: There is no evidence of fracture. Pelvic phleboliths are again noted. Surgical clips are noted in right lower quadrant.  IMPRESSION: Negative.   Electronically Signed   By: Natasha Mead M.D.   On: 06/04/2014 15:06   Ct Head Wo Contrast  06/04/2014   CLINICAL DATA:  Increasing confusion  EXAM: CT HEAD WITHOUT CONTRAST  TECHNIQUE: Contiguous axial images were obtained from the base of the skull through the vertex without intravenous contrast.  COMPARISON:  None.  FINDINGS: The bony calvarium is intact. No findings to  suggest acute hemorrhage, acute infarction or space-occupying mass lesion are noted.  IMPRESSION: No acute intracranial abnormality is noted.   Electronically Signed   By: Alcide Clever M.D.   On: 06/04/2014 16:12     Assessment/Plan:  73y/o woman with history of HTN, DM presenting for evaluation of fall and extremity twitching. Unclear etiology of fall, based on patients description there does not appear to be a true LOC. Physical exam pertinent for mild asterixis of bilateral UE which may explain noted muscle twitching. Suspect polypharmacy may be playing a role in her symptoms.   -Continue lyrica. Will discontinue gabapentin -Check EEG -Check B12, ammonia   Elspeth Cho, DO [facility] [phone]  If 7pm- 7am, please page neurology on call as listed in AMION. 06/04/2014, 7:14 PM

## 2014-06-04 NOTE — ED Notes (Signed)
Per family, patient has had increase in confusion over the past couple weeks. Also reports patient has had "jerking/twitching episodes". Today patient was letting the dogs out and while standing "jerked" and fell to floor, head hit chair. Pt. C/o pain to buttocks. Denies LOC, N/V. Pt. Is alert but confused. Does not remember fall.

## 2014-06-04 NOTE — ED Provider Notes (Signed)
CSN: [id number]     Arrival date & time 06/04/14  1307 History   First MD Initiated Contact with Patient 06/04/14 1339     Chief Complaint  Patient presents with  . Fall  . Altered Mental Status   HPI Pt remembers trying to let her dog out in the backyard.  She remembers opening up the door.  She remembers that she started to fall.  The next thing she knows is her daughter help her up.  Family saw her had a sudden jerk in her body.  She fell backwards suddenly after that happened.  She hit the back of her head on a recliner.  Over the last few days she has had some twitching in her arms and she has been forgetful.  Since she has been in the ED it resolved. Past Medical History  Diagnosis Date  . Arthritis   . Diabetes mellitus without complication   . Hyperlipidemia   . Hypertension   . Thyroid disease    History reviewed. No pertinent past surgical history. No family history on file. History  Substance Use Topics  . Smoking status: Never Smoker   . Smokeless tobacco: Never Used  . Alcohol Use: No   OB History    No data available     Review of Systems  Constitutional: Negative for fever.  Respiratory: Negative for shortness of breath.   Cardiovascular: Negative for chest pain.  Gastrointestinal: Negative for abdominal pain.  Genitourinary: Negative for dysuria.  Neurological: Positive for syncope. Negative for headaches.  All other systems reviewed and are negative.     Allergies  Review of patient's allergies indicates no known allergies.  Home Medications   Prior to Admission medications   Medication Sig Start Date End Date Taking? Authorizing Provider  cephALEXin (KEFLEX) 500 MG capsule Take 1 capsule (500 mg total) by mouth 4 (four) times daily. Patient not taking: Reported on 06/04/2014 02/03/14   Santiago Bumpers, DPM  diphenhydrAMINE (SOMINEX) 25 MG tablet Take 25 mg by mouth at bedtime as needed for allergies.   Yes Historical Provider, MD  ferrous sulfate 325  (65 FE) MG tablet Take 325 mg by mouth daily with breakfast.   Yes Historical Provider, MD  gabapentin (NEURONTIN) 300 MG capsule TAKE 1 CAPSULE (300 MG TOTAL) BY MOUTH 2 (TWO) TIMES DAILY. 02/10/14  Yes Richard Tuchman, DPM  hydrochlorothiazide (HYDRODIURIL) 25 MG tablet Take 25 mg by mouth daily.  03/06/13  Yes Historical Provider, MD  lansoprazole (PREVACID) 30 MG capsule Take 30 mg by mouth daily at 12 noon.  05/14/13  Yes Historical Provider, MD  lisinopril (PRINIVIL,ZESTRIL) 40 MG tablet Take 40 mg by mouth daily.  05/14/13  Yes Historical Provider, MD  LORazepam (ATIVAN) 0.5 MG tablet Take 0.5 mg by mouth every 6 (six) hours as needed for anxiety.  03/06/13   Historical Provider, MD  LYRICA 50 MG capsule Take 50 mg by mouth 2 (two) times daily. 05/13/14  Yes Historical Provider, MD  metFORMIN (GLUCOPHAGE) 1000 MG tablet Take 500 mg by mouth 2 (two) times daily with a meal.  05/06/13  Yes Historical Provider, MD  OXYCONTIN 60 MG T12A Take 60 mg by mouth 2 (two) times daily as needed (for arthritis pain).  04/12/13  Yes Historical Provider, MD  pravastatin (PRAVACHOL) 80 MG tablet Take 40 mg by mouth daily.  03/06/13   Historical Provider, MD  propylthiouracil (PTU) 50 MG tablet Take 50 mg by mouth 2 (two) times daily.   Yes  Historical Provider, MD  silver sulfADIAZINE (SILVADENE) 1 % cream Apply 1 application topically daily. 02/03/14   Richard Tuchman, DPM   BP 116/68 mmHg  Pulse 75  Temp(Src) 99.2 F (37.3 C)  Resp 12  Ht  (1.575 m)  SpO2 97% Physical Exam  Constitutional: She is oriented to person, place, and time. No distress.  HENT:  Head: Normocephalic and atraumatic.  Right Ear: External ear normal.  Left Ear: External ear normal.  Mouth/Throat: Oropharynx is clear and moist.  Eyes: Conjunctivae are normal. Right eye exhibits no discharge. Left eye exhibits no discharge. No scleral icterus.  Neck: Neck supple. No tracheal deviation present.  Cardiovascular: Normal rate, regular  rhythm and intact distal pulses.   Pulmonary/Chest: Effort normal and breath sounds normal. No stridor. No respiratory distress. She has no wheezes. She has no rales.  Abdominal: Soft. Bowel sounds are normal. She exhibits no distension. There is no tenderness. There is no rebound and no guarding.  Musculoskeletal: She exhibits no edema or tenderness.  Neurological: She is alert and oriented to person, place, and time. She has normal strength. No cranial nerve deficit (no facial droop, extraocular movements intact, no slurred speech) or sensory deficit. She exhibits normal muscle tone. She displays no seizure activity. Coordination normal.  No pronator drift bilateral upper extrem, able to hold both legs off bed for 5 seconds, sensation intact in all extremities, no visual field cuts, no left or right sided neglect, normal finger-nose exam bilaterally, no nystagmus noted   Skin: Skin is warm and dry. No rash noted.  Psychiatric: She has a normal mood and affect.  Nursing note and vitals reviewed.   ED Course  Procedures (including critical care time) Labs Review Labs Reviewed  COMPREHENSIVE METABOLIC PANEL - Abnormal; Notable for the following:    Potassium 5.7 (*)    Glucose, Bld 303 (*)    BUN 29 (*)    Creatinine, Ser 1.99 (*)    GFR calc non Af Amer 24 (*)    GFR calc Af Amer 28 (*)    All other components within normal limits  DIFFERENTIAL - Abnormal; Notable for the following:    Neutrophils Relative % 80 (*)    All other components within normal limits  CBG MONITORING, ED - Abnormal; Notable for the following:    Glucose-Capillary 301 (*)    All other components within normal limits  CBC  PROTIME-INR  APTT  URINALYSIS, ROUTINE W REFLEX MICROSCOPIC    Imaging Review Dg Lumbar Spine Complete  06/04/2014   CLINICAL DATA:  Fall, confusion  EXAM: LUMBAR SPINE - COMPLETE 4+ VIEW  COMPARISON:  None  FINDINGS: Five views of the lumbar spine submitted. No acute fracture or  subluxation. Mild disc space flattening at L5-S1 level. Mild anterior spurring at T12-L1 and L1-L2 level. Mild anterior spurring upper endplate of L3 vertebral body. Facet degenerative changes at L4 and L5 level.  IMPRESSION: No acute fracture or subluxation. Degenerative changes as described above.   Electronically Signed   By: Natasha Mead M.D.   On: 06/04/2014 15:05   Dg Sacrum/coccyx  06/04/2014   CLINICAL DATA:  Fall, altered mental status  EXAM: SACRUM AND COCCYX - 2+ VIEW  COMPARISON:  01/29/2014  FINDINGS: There is no evidence of fracture. Pelvic phleboliths are again noted. Surgical clips are noted in right lower quadrant.  IMPRESSION: Negative.   Electronically Signed   By: Natasha Mead M.D.   On: 06/04/2014 15:06   Ct  Head Wo Contrast  06/04/2014   CLINICAL DATA:  Increasing confusion  EXAM: CT HEAD WITHOUT CONTRAST  TECHNIQUE: Contiguous axial images were obtained from the base of the skull through the vertex without intravenous contrast.  COMPARISON:  None.  FINDINGS: The bony calvarium is intact. No findings to suggest acute hemorrhage, acute infarction or space-occupying mass lesion are noted.  IMPRESSION: No acute intracranial abnormality is noted.   Electronically Signed   By: Alcide CleverMark  Lukens M.D.   On: 06/04/2014 16:12     EKG Interpretation None     Sinus rhythm noted on the monitor. MDM   Final diagnoses:  Syncope, unspecified syncope type   Patient presents to the emergency room after having what sounds like a syncopal episode associated with some type of myoclonic jerk. Patient has a normal neurologic exam. I doubt stroke or TIA.  She has normal muscle movements at this time. Her symptoms do not sound like a seizure. There was no post ictal state.  This possible she had a syncopal episode. There was no warning.  Considering her age and combordities I will consult with hospitalist service for admission, observation.    The patient is hyperglycemic.  Her medications were reviewed  and the patient is supposed to be taking 2 tablets twice a day. She has only been taking 1 tablet twice a day.   Linwood DibblesJon Keegan Bensch, MD 06/04/14 1640

## 2014-06-05 ENCOUNTER — Inpatient Hospital Stay (HOSPITAL_COMMUNITY): Payer: Medicare Other

## 2014-06-05 DIAGNOSIS — R569 Unspecified convulsions: Secondary | ICD-10-CM

## 2014-06-05 DIAGNOSIS — E785 Hyperlipidemia, unspecified: Secondary | ICD-10-CM | POA: Diagnosis not present

## 2014-06-05 DIAGNOSIS — E119 Type 2 diabetes mellitus without complications: Secondary | ICD-10-CM | POA: Diagnosis not present

## 2014-06-05 DIAGNOSIS — N179 Acute kidney failure, unspecified: Secondary | ICD-10-CM

## 2014-06-05 DIAGNOSIS — I10 Essential (primary) hypertension: Secondary | ICD-10-CM

## 2014-06-05 DIAGNOSIS — R55 Syncope and collapse: Secondary | ICD-10-CM | POA: Diagnosis not present

## 2014-06-05 LAB — CBC
HEMATOCRIT: 35.4 % — AB (ref 36.0–46.0)
HEMOGLOBIN: 11.7 g/dL — AB (ref 12.0–15.0)
MCH: 30.5 pg (ref 26.0–34.0)
MCHC: 33.1 g/dL (ref 30.0–36.0)
MCV: 92.2 fL (ref 78.0–100.0)
PLATELETS: 215 10*3/uL (ref 150–400)
RBC: 3.84 MIL/uL — ABNORMAL LOW (ref 3.87–5.11)
RDW: 12.6 % (ref 11.5–15.5)
WBC: 5.9 10*3/uL (ref 4.0–10.5)

## 2014-06-05 LAB — BASIC METABOLIC PANEL
Anion gap: 7 (ref 5–15)
BUN: 23 mg/dL (ref 6–23)
CHLORIDE: 100 mmol/L (ref 96–112)
CO2: 26 mmol/L (ref 19–32)
CREATININE: 1.31 mg/dL — AB (ref 0.50–1.10)
Calcium: 8.9 mg/dL (ref 8.4–10.5)
GFR calc Af Amer: 46 mL/min — ABNORMAL LOW (ref >90)
GFR calc non Af Amer: 40 mL/min — ABNORMAL LOW (ref >90)
GLUCOSE: 232 mg/dL — AB (ref 70–99)
POTASSIUM: 5.2 mmol/L — AB (ref 3.5–5.1)
Sodium: 133 mmol/L — ABNORMAL LOW (ref 135–145)

## 2014-06-05 LAB — VITAMIN B12: Vitamin B-12: 261 pg/mL (ref 211–911)

## 2014-06-05 LAB — GLUCOSE, CAPILLARY
GLUCOSE-CAPILLARY: 282 mg/dL — AB (ref 70–99)
Glucose-Capillary: 275 mg/dL — ABNORMAL HIGH (ref 70–99)
Glucose-Capillary: 209 mg/dL — ABNORMAL HIGH (ref 70–99)
Glucose-Capillary: 192 mg/dL — ABNORMAL HIGH (ref 70–99)

## 2014-06-05 MED ORDER — LINAGLIPTIN 5 MG PO TABS
5.0000 mg | ORAL_TABLET | Freq: Every day | ORAL | Status: DC
  Administered 2014-06-05 – 2014-06-06 (×2): 5 mg via ORAL
  Filled 2014-06-05 (×2): qty 1

## 2014-06-05 MED ORDER — ENOXAPARIN SODIUM 40 MG/0.4ML ~~LOC~~ SOLN
40.0000 mg | SUBCUTANEOUS | Status: DC
  Administered 2014-06-05: 40 mg via SUBCUTANEOUS
  Filled 2014-06-05: qty 0.4

## 2014-06-05 NOTE — Progress Notes (Signed)
UR Completed Claudio Mondry Graves-Bigelow, RN,BSN [phone]  

## 2014-06-05 NOTE — Evaluation (Signed)
Physical Therapy Evaluation Patient Details Name: Alejandra Espinoza MRN: [id number] DOB: 1940-04-14 Today's Date: 06/05/2014   History of Present Illness  Pt is a 74 y.o. Female admitted 06/04/14 with seizure-like activity. Pt had sudden jerking movements resulting in a fall at home. Pt has been forgetful in the past few months and has been having some twitching in her UEs. PMH: OA, DM, HTN, HL.   Clinical Impression  Pt admitted with above diagnosis. Pt currently with functional limitations due to the deficits listed below (see PT Problem List). Pt will need to use RW at all times and have 24 hour care.  Son aware and states he can provide.  HHPT needed at home.   Pt will benefit from skilled PT to increase their independence and safety with mobility to allow discharge to the venue listed below.      Follow Up Recommendations Home health PT;Supervision/Assistance - 24 hour    Equipment Recommendations  None recommended by PT    Recommendations for Other Services       Precautions / Restrictions Precautions Precautions: Fall Restrictions Weight Bearing Restrictions: No      Mobility  Bed Mobility Overal bed mobility: Needs Assistance Bed Mobility: Supine to Sit     Supine to sit: Mod assist     General bed mobility comments: Pt sitting up in recliner when OT arrived  Transfers Overall transfer level: Needs assistance Equipment used: Rolling walker (2 wheeled) Transfers: Sit to/from Stand Sit to Stand: Supervision         General transfer comment: Good technique. No physical assist needed. Supervision for safety.   Ambulation/Gait Ambulation/Gait assistance: Min guard;Min assist Ambulation Distance (Feet): 169 Feet Assistive device: Rolling walker (2 wheeled) Gait Pattern/deviations: Step-to pattern;Decreased stride length;Decreased step length - right;Decreased weight shift to right;Drifts right/left   Gait velocity interpretation: Below normal speed for  age/gender General Gait Details: Pt with slight impulsivity with movement.  Needed cues to slow down.  Pt has lag of right LE and is unsteady without RW.  With RW, still needed close guard assist.  Son present and states that pt is walking a little worse than PTA.  He is aware that pt will need 24 hour care and RW at all times.    Stairs            Wheelchair Mobility    Modified Rankin (Stroke Patients Only)       Balance Overall balance assessment: Needs assistance;History of Falls         Standing balance support: Bilateral upper extremity supported;During functional activity Standing balance-Leahy Scale: Poor Standing balance comment: Requires UE support for balance.               High level balance activites: Direction changes;Turns;Sudden stops High Level Balance Comments: Needs min assit as pt somewhat unsteady.             Pertinent Vitals/Pain Pain Assessment: 0-10 Pain Score: 8  Pain Location: sacrum/ buttocks Pain Descriptors / Indicators: Sore Pain Intervention(s): Limited activity within patient's tolerance;Monitored during session;Repositioned  VSS  Home Living Family/patient expects to be discharged to:: Private residence Living Arrangements: Spouse/significant other;Children Available Help at Discharge: Family;Available 24 hours/day (lives with son and wife and pt's husband) Type of Home: House Home Access: Stairs to enter Entrance Stairs-Rails: None Entrance Stairs-Number of Steps: 1 Home Layout: One level Home Equipment: Bedside commode;Walker - 4 wheels;Cane - single point;Grab bars - tub/shower      Prior Function Level  of Independence: Independent         Comments: Pt did everything for herself.  Drives      Hand Dominance   Dominant Hand: Right    Extremity/Trunk Assessment   Upper Extremity Assessment: RUE deficits/detail;LUE deficits/detail RUE Deficits / Details: arthritis in R hand with mild deformity of MCPs and  middle finger. Mild intention tremors in hand during finger to nose.      LUE Deficits / Details: mild intention tremors in hand during finger to nose   Lower Extremity Assessment: Defer to PT evaluation      Cervical / Trunk Assessment: Normal  Communication   Communication: No difficulties  Cognition Arousal/Alertness: Awake/alert Behavior During Therapy: WFL for tasks assessed/performed Overall Cognitive Status: Within Functional Limits for tasks assessed       Memory: Decreased short-term memory              General Comments      Exercises General Exercises - Lower Extremity Ankle Circles/Pumps: AROM;Both;10 reps;Supine Long Arc Quad: AROM;Both;10 reps;Seated      Assessment/Plan    PT Assessment Patient needs continued PT services  PT Diagnosis Generalized weakness   PT Problem List Decreased activity tolerance;Decreased balance;Decreased mobility;Decreased knowledge of use of DME;Decreased safety awareness;Decreased knowledge of precautions  PT Treatment Interventions DME instruction;Gait training;Functional mobility training;Therapeutic activities;Therapeutic exercise;Balance training;Patient/family education;Stair training   PT Goals (Current goals can be found in the Care Plan section) Acute Rehab PT Goals Patient Stated Goal: to goh ome PT Goal Formulation: With patient Time For Goal Achievement: 06/12/14 Potential to Achieve Goals: Good    Frequency Min 3X/week   Barriers to discharge        Co-evaluation               End of Session Equipment Utilized During Treatment: Gait belt Activity Tolerance: Patient limited by fatigue Patient left: in chair;with call bell/phone within reach;with chair alarm set;with family/visitor present Nurse Communication: Mobility status         Time: [id number] PT Time Calculation (min) (ACUTE ONLY): 20 min   Charges:   PT Evaluation $Initial PT Evaluation Tier I: 1 Procedure     PT G CodesBerline Lopes:         Brock Larmon F 06/05/2014, 1:54 PM Marlyn Rabine [facility],PT Acute Rehabilitation [phone] (pager)

## 2014-06-05 NOTE — Clinical Documentation Improvement (Signed)
  H&P states "acute renal insufficiency". In the Coding world this term equates to "disorder of kidney and ureter". Please clarify term to better illustrate severity of illness and risk of mortality.  Supporting Information: -- BUN/Cr: 3/2: 29/1.99,  3/3: 23/1.31 -- Gfr: 3/2: 24/28,  3/3: 40/46 -- IV NS 75 ml/hr, hold Metformin, Lisinopril, HCTZ  Possible Conditions -- AKI -- Acute renal failure on possible CKD -- Other condition  Thank Bonita QuinYou,  Clide CliffLaurie Nazanin Kinner RN [facility] [phone] HIM department

## 2014-06-05 NOTE — Procedures (Signed)
ELECTROENCEPHALOGRAM REPORT  Patient: Alejandra Espinoza       Room #: 1O103W06 EEG No. ID: [id number] Age: 74 y.o.        Sex: female Referring Physician: Ahmed PrimaELMAHI, M Report Date:  06/05/2014        Interpreting Physician: Aline BrochureSTEWART,Tevan Marian R  History: Alejandra BaizeLinda B Andreas is an 74 y.o. female presenting with a history of intermittent extremity jerky type movements as well as forgetfulness for several months. She also had one episode of a violent whole-body jerk, throwing up to the ground without loss of consciousness.  Indications for study:  Rule out seizure disorder.  Technique: This is an 18 channel routine scalp EEG performed at the bedside with bipolar and monopolar montages arranged in accordance to the international 10/20 system of electrode placement.   Description: This EEG recording was performed during wakefulness. Predominant cerebral activity consisted of low amplitude diffuse 1-2 Hz delta activity with superimposed 7-8 Hz rhythmic activity which was most prominent posteriorly. Intermittent moderate amplitude somewhat rhythmic generalized delta activity was also noted frequently, lasting several seconds. Photic stimulation and hyperventilation were not performed. No epileptiform discharges were recorded.  Interpretation: This EEG is abnormal with mild generalized nonspecific slowing of cerebral activity. Findings can be seen with degenerative as well as metabolic and toxic encephalopathies. No evidence of seizure disorder was demonstrated. The lack of epileptiform activity on an EEG recording does not rule out seizure disorder, however.   Venetia MaxonR Karee Forge M.D. [facility] [phone]

## 2014-06-05 NOTE — Progress Notes (Signed)
[facility] PROGRESS NOTE   Alejandra Espinoza:[id number] DOB: 14-Nov-1940 DOA: 06/04/2014 PCP: Michiel Sites, MD  HPI/Subjective: Alert but disoriented, denies any specific complaints.  Assessment/Plan: Principal Problem:   Seizure-like activity Active Problems:   Diabetes mellitus without complication   Hyperlipidemia   Hypertension   AKI (acute kidney injury)    Seizure-like activity:  -Cannot rule out medication withdrawal as cause, patient appears to be very forgetful to take her medications. -Admit to neuro telemetry, place on neuro checks, seizure precautions -Seen by neurology, recommended to discontinue Neurontin for now. -EEG done, results pending. -No seizure-like activity while she is in the hospital.  Acute renal failure -Patient presented with creatinine of 1.99, presumed acute, baseline creatinine is unknown. -Creatinine improved with IV hydration to 1.3, continue IV fluids for total of 2 L  Diabetes mellitus without complication - Obtain hemoglobin A1c, place on sliding scale insulin - Metformin held, her fasting blood glucose is 232 this morning. - I think patient is not a great candidate for insulin as this is might increase her chance to develop hypoglycemia.  Hyperlipidemia Hold statins for now, check CK to rule out rhabdomyolysis  Hypertension - BP currently stable, hold lisinopril and HCTZ  GERD - Placed on PPI  Chronic pain syndrome, arthritis -Placed on oral oxycodone as needed, IV Dilaudid for severe pain.  -Patient would benefit from pain clinic management for chronic arthritis pain being on high doses of narcotics in light of forgetfulness and dementia. This is been discussed this with the patient's family to bring it up to their PCPs attention.   Code Status: Full Code Family Communication: Plan discussed with the patient. Disposition Plan: Remains inpatient Diet: Diet Carb  Modified  Consultants:  Neurology  Procedures:  EEG, results pending  Antibiotics:  None   Objective: Filed Vitals:   06/05/14 0832  BP:   Pulse:   Temp: 98.5 F (36.9 C)  Resp:     Intake/Output Summary (Last 24 hours) at 06/05/14 1106 Last data filed at 06/05/14 1039  Gross per 24 hour  Intake   1020 ml  Output   1950 ml  Net   -930 ml   Filed Weights   06/04/14 2000 06/05/14 0441  Weight: 69.4 kg (153 lb) 69.4 kg (153 lb)    Exam: General: Alert and awake, oriented x3, not in any acute distress. HEENT: anicteric sclera, pupils reactive to light and accommodation, EOMI CVS: S1-S2 clear, no murmur rubs or gallops Chest: clear to auscultation bilaterally, no wheezing, rales or rhonchi Abdomen: soft nontender, nondistended, normal bowel sounds, no organomegaly Extremities: no cyanosis, clubbing or edema noted bilaterally Neuro: Cranial nerves II-XII intact, no focal neurological deficits  Data Reviewed: Basic Metabolic Panel:  Recent Labs Lab 06/04/14 1325 06/05/14 0433  NA 135 133*  K 5.7* 5.2*  CL 100 100  CO2 27 26  GLUCOSE 303* 232*  BUN 29* 23  CREATININE 1.99* 1.31*  CALCIUM 9.2 8.9   Liver Function Tests:  Recent Labs Lab 06/04/14 1325  AST 16  ALT 12  ALKPHOS 54  BILITOT 0.8  PROT 7.7  ALBUMIN 3.6   No results for input(s): LIPASE, AMYLASE in the last 168 hours.  Recent Labs Lab 06/04/14 2100  AMMONIA <9*   CBC:  Recent Labs Lab 06/04/14 1325 06/05/14 0433  WBC 8.7 5.9  NEUTROABS 7.2  --   HGB 12.5 11.7*  HCT 37.9 35.4*  MCV 94.3 92.2  PLT 214 215   Cardiac Enzymes:  Recent Labs Lab 06/04/14 1724  CKTOTAL 321*   BNP (last 3 results) No results for input(s): BNP in the last 8760 hours.  ProBNP (last 3 results) No results for input(s): PROBNP in the last 8760 hours.  CBG:  Recent Labs Lab 06/04/14 1327 06/04/14 1816 06/04/14 2149 06/05/14 0747  GLUCAP 301* 114* 305* 275*    Micro No results  found for this or any previous visit (from the past 240 hour(s)).   Studies: Dg Lumbar Spine Complete  06/04/2014   CLINICAL DATA:  Fall, confusion  EXAM: LUMBAR SPINE - COMPLETE 4+ VIEW  COMPARISON:  None  FINDINGS: Five views of the lumbar spine submitted. No acute fracture or subluxation. Mild disc space flattening at L5-S1 level. Mild anterior spurring at T12-L1 and L1-L2 level. Mild anterior spurring upper endplate of L3 vertebral body. Facet degenerative changes at L4 and L5 level.  IMPRESSION: No acute fracture or subluxation. Degenerative changes as described above.   Electronically Signed   By: Natasha MeadLiviu  Pop M.D.   On: 06/04/2014 15:05   Dg Sacrum/coccyx  06/04/2014   CLINICAL DATA:  Fall, altered mental status  EXAM: SACRUM AND COCCYX - 2+ VIEW  COMPARISON:  01/29/2014  FINDINGS: There is no evidence of fracture. Pelvic phleboliths are again noted. Surgical clips are noted in right lower quadrant.  IMPRESSION: Negative.   Electronically Signed   By: Natasha MeadLiviu  Pop M.D.   On: 06/04/2014 15:06   Ct Head Wo Contrast  06/04/2014   CLINICAL DATA:  Increasing confusion  EXAM: CT HEAD WITHOUT CONTRAST  TECHNIQUE: Contiguous axial images were obtained from the base of the skull through the vertex without intravenous contrast.  COMPARISON:  None.  FINDINGS: The bony calvarium is intact. No findings to suggest acute hemorrhage, acute infarction or space-occupying mass lesion are noted.  IMPRESSION: No acute intracranial abnormality is noted.   Electronically Signed   By: Alcide CleverMark  Lukens M.D.   On: 06/04/2014 16:12    Scheduled Meds: . enoxaparin (LOVENOX) injection  40 mg Subcutaneous Q24H  . ferrous sulfate  325 mg Oral Q breakfast  . insulin aspart  0-5 Units Subcutaneous QHS  . insulin aspart  0-9 Units Subcutaneous TID WC  . pantoprazole  40 mg Oral Daily  . pregabalin  50 mg Oral BID  . propylthiouracil  50 mg Oral BID  . sodium chloride  3 mL Intravenous Q12H   Continuous Infusions: . sodium  chloride 75 mL/hr at 06/05/14 1100       Time spent: 35 minutes    [facility] LLCELMAHI,Terrill Wauters A  [facility] Pager [phone] If 7PM-7AM, please contact night-coverage at [email or web address], password [email or web address] 06/05/2014, 11:06 AM  LOS: 1 day

## 2014-06-05 NOTE — Progress Notes (Signed)
EEG Completed; Results Pending  

## 2014-06-05 NOTE — Progress Notes (Signed)
Inpatient Diabetes Program Recommendations  AACE/ADA: New Consensus Statement on Inpatient Glycemic Control (2013)  Target Ranges:  Prepandial:   less than 140 mg/dL      Peak postprandial:   less than 180 mg/dL (1-2 hours)      Critically ill patients:  140 - 180 mg/dL   Reason for Visit: Hyperglycemia  Diabetes history: Dm2 Outpatient Diabetes medications: metformin  Current orders for Inpatient glycemic control: Novolog sensitive tidwc and hs  Blood sugars elevated.  Inpatient Diabetes Program Recommendations Insulin - Basal: Consider addition of Levemir 10 units QHS Insulin - Meal Coverage: If CBGs continue >180 mg/dL, add Novolog 3 units tidwc for meal coverage insulin HgbA1C: Pending  Note: Will continue to follow.  Thank you. Ailene Ardshonda Mycheal Veldhuizen, RD, LDN, CDE Inpatient Diabetes Coordinator [phone]

## 2014-06-05 NOTE — Progress Notes (Signed)
Subjective: Twitching has improved.   Exam: Filed Vitals:   06/05/14 0832  BP:   Pulse:   Temp: 98.5 F (36.9 C)  Resp:    Gen: In bed, NAD MS: awake, alert, oriented.  NW:GNFAOCN:PERRL, EOMI Motor: no myoclonus today.   Pertinent Labs: Creatinine is improving.   EEG no signs of seizure.   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 She is on multiple medications that can be assocaited with myoclonus including lyrica, gabapentin, narcotics. My suspicion is that with her elevated Cr, she was not clearing the meds as well and developed the myoclonus and a myoclonic leg jerk could certainly contribute to a fall.   Recommendations: 1) Agree with stopping gabapentin and continuing lyrica.  2) will continue to follow.   Ritta SlotMcNeill Miklos Bidinger, MD [facility] [phone]  If 7pm- 7am, please page neurology on call as listed in [facility].

## 2014-06-05 NOTE — Evaluation (Signed)
Occupational Therapy Evaluation Patient Details Name: Alejandra BaizeLinda B Wetmore MRN: [id number] DOB: [birth year] Today's Date: 06/05/2014    History of Present Illness Pt is a 74 y.o. Female admitted 06/04/14 with seizure-like activity. Pt had sudden jerking movements resulting in a fall at home. Pt has been forgetful in the past few months and has been having some twitching in her UEs. PMH: OA, DM, HTN, HL.    Clinical Impression   PTA pt lived at home with family support and was independent with ADLs. Pt requires supervision for functional transfers and LB ADLs at this time. Pt has mild intention tremors in UEs during finger to nose and R hand is limited by arthritis with mild deformity at MCPs. Educated pt and son on use of BSC in shower and ways to adapt environment to increase independence and safety. No further acute OT needs at this time. Discussed the use of a weekly pill-box as well as family assistance with medication management.     Follow Up Recommendations  No OT follow up;Supervision/Assistance - 24 hour    Equipment Recommendations  Other (comment) (weekly pill-box)    Recommendations for Other Services       Precautions / Restrictions Restrictions Weight Bearing Restrictions: No      Mobility Bed Mobility               General bed mobility comments: Pt sitting up in recliner when OT arrived  Transfers Overall transfer level: Needs assistance Equipment used: Rolling walker (2 wheeled) Transfers: Sit to/from Stand Sit to Stand: Supervision         General transfer comment: Good technique. No physical assist needed. Supervision for safety.          ADL Overall ADL's : Needs assistance/impaired Eating/Feeding: Independent;Sitting   Grooming: Set up;Sitting   Upper Body Bathing: Set up;Sitting   Lower Body Bathing: Sit to/from stand;Supervison/ safety   Upper Body Dressing : Set up;Sitting   Lower Body Dressing: Sit to/from stand;Supervision/safety    Toilet Transfer: Supervision/safety;Ambulation;RW Toilet Transfer Details (indicate cue type and reason): sit<>stand from recliner Toileting- Clothing Manipulation and Hygiene: Supervision/safety;Sit to/from stand     Tub/Shower Transfer Details (indicate cue type and reason): discussed use of 3N1 in tub for increased safety.          Vision Additional Comments: Pt reports no change from baseline.           Pertinent Vitals/Pain Pain Assessment: 0-10 Pain Score: 8  Pain Location: sacrum/ buttocks Pain Descriptors / Indicators: Sore Pain Intervention(s): Limited activity within patient's tolerance;Monitored during session;Repositioned     Hand Dominance Right   Extremity/Trunk Assessment Upper Extremity Assessment Upper Extremity Assessment: RUE deficits/detail;LUE deficits/detail RUE Deficits / Details: arthritis in R hand with mild deformity of MCPs and middle finger. Mild intention tremors in hand during finger to nose.  LUE Deficits / Details: mild intention tremors in hand during finger to nose   Lower Extremity Assessment Lower Extremity Assessment: Defer to PT evaluation   Cervical / Trunk Assessment Cervical / Trunk Assessment: Normal   Communication Communication Communication: No difficulties   Cognition Arousal/Alertness: Awake/alert Behavior During Therapy: WFL for tasks assessed/performed Overall Cognitive Status: Within Functional Limits for tasks assessed       Memory: Decreased short-term memory                        Home Living Family/patient expects to be discharged to:: Private residence Living Arrangements: Spouse/significant other;Children Available  Help at Discharge: Family;Available 24 hours/day (lives with son and wife and pt's husband) Type of Home: House Home Access: Stairs to enter Secretary/administrator of Steps: 1 Entrance Stairs-Rails: None Home Layout: One level     Bathroom Shower/Tub: Tub/shower unit (side of tub  is low)   Firefighter: Standard     Home Equipment: Bedside commode;Walker - 4 wheels;Cane - single point;Grab bars - tub/shower          Prior Functioning/Environment Level of Independence: Independent        Comments: Pt did everything for herself.  Drives     OT Diagnosis: Generalized weakness;Acute pain    End of Session Equipment Utilized During Treatment: Rolling walker  Activity Tolerance: Patient tolerated treatment well Patient left: in chair;with call bell/phone within reach;with family/visitor present   Time: 1110-1136 OT Time Calculation (min): 26 min Charges:  OT General Charges $OT Visit: 1 Procedure OT Evaluation $Initial OT Evaluation Tier I: 1 Procedure OT Treatments $Self Care/Home Management : 8-22 mins G-Codes:    Rae Lips 06-21-2014, 11:48 AM  Carney Living, OTR/L Occupational Therapist [phone] (pager)

## 2014-06-06 DIAGNOSIS — E785 Hyperlipidemia, unspecified: Secondary | ICD-10-CM | POA: Diagnosis not present

## 2014-06-06 DIAGNOSIS — N179 Acute kidney failure, unspecified: Secondary | ICD-10-CM | POA: Diagnosis not present

## 2014-06-06 DIAGNOSIS — R55 Syncope and collapse: Secondary | ICD-10-CM | POA: Diagnosis not present

## 2014-06-06 DIAGNOSIS — E119 Type 2 diabetes mellitus without complications: Secondary | ICD-10-CM | POA: Diagnosis not present

## 2014-06-06 DIAGNOSIS — R569 Unspecified convulsions: Secondary | ICD-10-CM | POA: Diagnosis not present

## 2014-06-06 LAB — BASIC METABOLIC PANEL
Anion gap: 6 (ref 5–15)
BUN: 19 mg/dL (ref 6–23)
CO2: 27 mmol/L (ref 19–32)
Calcium: 8.7 mg/dL (ref 8.4–10.5)
Chloride: 103 mmol/L (ref 96–112)
Creatinine, Ser: 1.17 mg/dL — ABNORMAL HIGH (ref 0.50–1.10)
GFR calc Af Amer: 53 mL/min — ABNORMAL LOW (ref >90)
GFR, EST NON AFRICAN AMERICAN: 46 mL/min — AB (ref >90)
GLUCOSE: 263 mg/dL — AB (ref 70–99)
Potassium: 5.3 mmol/L — ABNORMAL HIGH (ref 3.5–5.1)
SODIUM: 136 mmol/L (ref 135–145)

## 2014-06-06 LAB — URINE CULTURE
COLONY COUNT: NO GROWTH
CULTURE: NO GROWTH

## 2014-06-06 LAB — GLUCOSE, CAPILLARY
GLUCOSE-CAPILLARY: 170 mg/dL — AB (ref 70–99)
Glucose-Capillary: 194 mg/dL — ABNORMAL HIGH (ref 70–99)

## 2014-06-06 LAB — HEMOGLOBIN A1C
Hgb A1c MFr Bld: 12.5 % — ABNORMAL HIGH (ref 4.8–5.6)
MEAN PLASMA GLUCOSE: 312 mg/dL

## 2014-06-06 MED ORDER — METFORMIN HCL 1000 MG PO TABS: 500.0000 mg | ORAL_TABLET | Freq: Two times a day (BID) | ORAL | Status: DC

## 2014-06-06 MED ORDER — LISINOPRIL 20 MG PO TABS: 40.0000 mg | ORAL_TABLET | Freq: Every day | ORAL | Status: DC

## 2014-06-06 MED ORDER — SODIUM POLYSTYRENE SULFONATE 15 GM/60ML PO SUSP
15.0000 g | Freq: Once | ORAL | Status: AC
  Administered 2014-06-06: 15 g via ORAL
  Filled 2014-06-06: qty 60

## 2014-06-06 MED ORDER — LINAGLIPTIN 5 MG PO TABS: 5.0000 mg | ORAL_TABLET | Freq: Every day | ORAL | Status: DC

## 2014-06-06 MED ORDER — HYDROCODONE-ACETAMINOPHEN 5-325 MG PO TABS: 1.0000 | ORAL_TABLET | Freq: Four times a day (QID) | ORAL | Status: DC | PRN

## 2014-06-06 NOTE — Progress Notes (Signed)
Inpatient Diabetes Program Recommendations  AACE/ADA: New Consensus Statement on Inpatient Glycemic Control (2013)  Target Ranges:  Prepandial:   less than 140 mg/dL      Peak postprandial:   less than 180 mg/dL (1-2 hours)      Critically ill patients:  140 - 180 mg/dL   Reason for Assessment:   Results for Jewel BaizeCARTER, Gionni B (MRN [id number]) as of 06/06/2014 10:29  Ref. Range 06/04/2014 20:18  Hemoglobin A1C Latest Range: 4.8-5.6 % 12.5 (H)   Inpatient Diabetes Program Recommendations Insulin - Basal: Consider addition of Levemir 10 units QHS Insulin - Meal Coverage: If CBGs continue >180 mg/dL, add Novolog 3 units tidwc for meal coverage insulin HgbA1C: Pending  Note: Based on A1C, patient appears to need insulin at discharge.  Note that safety is very important and avoidance of hypoglycemia.  Will discuss diabetes management with patient/family.  Thanks, Beryl MeagerJenny Adalie Mand, RN, BC-ADM Inpatient Diabetes Coordinator Pager [phone]

## 2014-06-06 NOTE — Progress Notes (Signed)
Spoke with patient and son regarding A1C results and diabetes control.  Asked patient what her blood sugars run at home.  She states, some are high and some are low.  Son states that he just discovered that her meter was not working.  His parents live with him.  He state that his wife has gone to get a new meter today.  Discussed that MD has started a new diabetes medication that is to be taken along with the Metformin.  Encouraged close follow-up with Dr. Juleen ChinaKohut.  Also instructed her to monitor blood sugars twice daily and take log to Dr. Juleen ChinaKohut so that medications can be adjusted appropriately.  Son states that he will be home with patient and plans to take a much more active role in helping her with her medications, etc.    Thanks, Beryl MeagerJenny Genean Adamski, RN, BC-ADM Inpatient Diabetes Coordinator Pager [phone]

## 2014-06-06 NOTE — Progress Notes (Signed)
Subjective: Asterixis fully resolved.  Continues to have intention tremor but this is baseline for patient.   Exam: Filed Vitals:   06/06/14 0850  BP: 148/79  Pulse: 65  Temp: 98.2 F (36.8 C)  Resp: 16   Gen: In bed, NAD MS: alert and oriented CN: EOMI, PERRLA, TML, face symmetrical Motor: 4/5 throughout, no asterixis, intention tremor on F-N Sensory:intact throughout   Pertinent Labs: Cr 1.17  K+ 5.3 Glucose 263  EEG no signs of seizure.    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 She is on multiple medications that can be assocaited with myoclonus including lyrica, gabapentin, narcotics. Cr has improved and is now 1.17.   Recommendations: 1)Agree with stopping gabapentin and continuing lyrica.   Felicie MornDavid Ryenn Howeth PA-C [facility] [phone]  06/06/2014, 10:16 AM

## 2014-06-06 NOTE — Discharge Instructions (Signed)
Basics of Medication Management UNDERSTAND YOUR MEDICATIONS  Read all of the labels and inserts that come with your medications. Review the information on this form often.  Know what potential side effects to look for (for each medication).  Know what each of your medications look like (by color, shape, size, stamp). If you are getting confused and having a hard time telling them apart, talk with your caregiver or pharmacist. They may be able to change the medication or help you to identify them more easily.  Check with your pharmacist if you notice a difference in the size or color of your medication.  Get all of your medications at one pharmacy. The pharmacist will have all of your information and understand possible drug interactions.  Ask your caregiver questions about your prescriptions and any over-the-counter medications, vitamins, herbal or dietary supplements that you take. TAKE YOUR MEDICATION SAFELY  Take medications only as prescribed.  Talk with your caregiver or pharmacist if some of your pills look the same and it is difficult to tell them apart. They can help you to recognize different medications.  Never double up on your medication.  Never take anyone else's medication or share your medications.  Do not stop taking your medication(s) unless you have discussed it with your caregiver.  Do not split, mash, or chew medications unless your caregiver tells you to do so. Tell your caregiver if you have trouble swallowing your medication(s).  For liquid medications, make sure you use the dosing container provided to you.  You may need to avoid alcohol or certain foods or liquids with one or more of your medications. Make sure you remember how to take each medication with some of the tools below. ORGANIZE YOUR MEDICATIONS  Use a tool, such as a weekly pill box (available at your local pharmacy), written chart from your caregiver, notebook, binder, or your own calendar to  organize your daily medications. Please note: if you are having trouble telling your different medications apart, keep them in the original bottles.  Set cues or reminders for taking your medications. Use watch alarms, mobile device/phone calendar alarms, or sticky notes.  More advanced medication management systems are also available. These offer weekly or monthly options complete with storage, alarms, and visual and audio prompts.  Your system should help you to keep track of the:  Name of medication and dose.  Day.  Time.  Pill to take (by color, shape, size, or name/imprint).  How to take it (with or without certain foods, on an empty stomach, with fluids etc.).  Review your medication schedule with a family member or friend to help you. Other household members should understand your medications.  If you are taking medications on an "as needed" basis such as those for nausea or pain, write down the name, dose, and time you took the medication so that you remember what you have taken. PLAN AHEAD FOR REFILLS AND TRAVEL  Take your pill box, medications, and calendar system with you when you travel.  Plan ahead for refills as to not run out of your medication(s).  Always carry an updated list of your medications with you. If there is an emergency, a respondent can quickly see what medications you are taking. STORE AND DISCARD YOUR MEDICATIONS SAFELY  Store medications in a cool, dry area away from light. (The bathroom is not a good place for storage because of heat and humidity.)  Store your medications away from chemicals, pet medications, or other family member's  medications.  Keep medications out of children's reach, away from counters and bedside tables. Store them up high in cabinets or shelves.  Check expiration dates regularly.  Learn about the best way to dispose of each medication you take. Find out if your local government recycling program has a Medicine Take Back  program for safe disposal. If not, some medications may be mixed with inedible substances and thrown away in the trash. Certain medications are to be flushed down the toilet. REMEMBER:  Tell your caregiver if you experience side effects, new symptoms, or have other concerns. There may be dosing changes or alternative medications that would be better for you.  Review your medications regularly with your caregiver. Check to see if you need to continue to take each medication, and discuss how well they are working. Medicines, diet, medical conditions, weight changes, and other habits can all affect how medicines work. PEDIATRIC CONSIDERATIONS If you are taking care of an infant or child who needs multiple medications, follow the tips above to organize a medication schedule and safely give and store medications.   Use positive reinforcement (singing, cuddling, reward) for your child to help him or her take necessary medications.  Use only syringes, droppers, dosing spoons, or dosing cups provided by your caregiver or pharmacist.  Always wash your hands before giving medications.  Get to know your child's school medication policies. Meet with the school nurse to review the medication schedule in detail. Never send the medication to school with your child.  Check with your child's caregiver if he or she has trouble taking medication, forgets a dose, or spits it up.  Make sure your child knows how to use an inhaler properly if needed.  Do not give your child over-the-counter cough and cold medicines if they are under 34 years of age.  Avoid giving your child or teenager Aspirin or Aspirin-containing products. Document Released: 07/06/2010 Document Revised: 06/13/2011 Document Reviewed: 07/06/2010 [facility] Patient Information 2015 [place], [place]. This information is not intended to replace advice given to you by your health care provider. Make sure you discuss any questions you have with your  health care provider.  Diabetes Mellitus and Food It is important for you to manage your blood sugar (glucose) level. Your blood glucose level can be greatly affected by what you eat. Eating healthier foods in the appropriate amounts throughout the day at about the same time each day will help you control your blood glucose level. It can also help slow or prevent worsening of your diabetes mellitus. Healthy eating may even help you improve the level of your blood pressure and reach or maintain a healthy weight.  HOW CAN FOOD AFFECT ME? Carbohydrates Carbohydrates affect your blood glucose level more than any other type of food. Your dietitian will help you determine how many carbohydrates to eat at each meal and teach you how to count carbohydrates. Counting carbohydrates is important to keep your blood glucose at a healthy level, especially if you are using insulin or taking certain medicines for diabetes mellitus. Alcohol Alcohol can cause sudden decreases in blood glucose (hypoglycemia), especially if you use insulin or take certain medicines for diabetes mellitus. Hypoglycemia can be a life-threatening condition. Symptoms of hypoglycemia (sleepiness, dizziness, and disorientation) are similar to symptoms of having too much alcohol.  If your health care provider has given you approval to drink alcohol, do so in moderation and use the following guidelines:  Women should not have more than one drink per day, and  men should not have more than two drinks per day. One drink is equal to:  12 oz of beer.  5 oz of wine.  1 oz of hard liquor.  Do not drink on an empty stomach.  Keep yourself hydrated. Have water, diet soda, or unsweetened iced tea.  Regular soda, juice, and other mixers might contain a lot of carbohydrates and should be counted. WHAT FOODS ARE NOT RECOMMENDED? As you make food choices, it is important to remember that all foods are not the same. Some foods have fewer nutrients  per serving than other foods, even though they might have the same number of calories or carbohydrates. It is difficult to get your body what it needs when you eat foods with fewer nutrients. Examples of foods that you should avoid that are high in calories and carbohydrates but low in nutrients include:  Trans fats (most processed foods list trans fats on the Nutrition Facts label).  Regular soda.  Juice.  Candy.  Sweets, such as cake, pie, doughnuts, and cookies.  Fried foods. WHAT FOODS CAN I EAT? Have nutrient-rich foods, which will nourish your body and keep you healthy. The food you should eat also will depend on several factors, including:  The calories you need.  The medicines you take.  Your weight.  Your blood glucose level.  Your blood pressure level.  Your cholesterol level. You also should eat a variety of foods, including:  Protein, such as meat, poultry, fish, tofu, nuts, and seeds (lean animal proteins are best).  Fruits.  Vegetables.  Dairy products, such as milk, cheese, and yogurt (low fat is best).  Breads, grains, pasta, cereal, rice, and beans.  Fats such as olive oil, trans fat-free margarine, canola oil, avocado, and olives. DOES EVERYONE WITH DIABETES MELLITUS HAVE THE SAME MEAL PLAN? Because every person with diabetes mellitus is different, there is not one meal plan that works for everyone. It is very important that you meet with a dietitian who will help you create a meal plan that is just right for you. Document Released: 12/16/2004 Document Revised: 03/26/2013 Document Reviewed: 02/15/2013 [facility] Patient Information 2015 [place]. This information is not intended to replace advice given to you by your health care provider. Make sure you discuss any questions you have with your health care provider.

## 2014-06-06 NOTE — Discharge Summary (Signed)
Physician Discharge Summary  Alejandra Espinoza ZOX:[id number] DOB: 14-Apr-1940 DOA: 06/04/2014  PCP: Michiel Sites, MD  Admit date: 06/04/2014 Discharge date: 06/06/2014  Time spent: 40 minutes  Recommendations for Outpatient Follow-up:  1. Follow-up with primary care physician within one week. 2. Check BMP in 1 week. 3. OxyContin discontinued, patient started on Norco 5/325 one tablet every 6 hours as needed for pain.  Discharge Diagnoses:  Principal Problem:   Seizure-like activity Active Problems:   Diabetes mellitus without complication   Hyperlipidemia   Hypertension   AKI (acute kidney injury)   Discharge Condition: Stable  Diet recommendation: Heart healthy  Filed Weights   06/04/14 2000 06/05/14 0441  Weight: 69.4 kg (153 lb) 69.4 kg (153 lb)    History of present illness:  Patient is a 74 year old female with history of osteoarthritis, diabetes mellitus, hypertension, hyperlipidemia, hyperthyroidism presented with seizure-like activity today morning. History was obtained from the patient and her daughter-in-law in the room. Patient remembers trying to get her dog out in the backyard, opening the door and then fell. Her daughter-in-law who witnessed this reported that patient was having sudden jerking movement in her body then she fell backwards. Patient hit the back of her head and sacrum. Patient has been forgetful in the last few months and over the last few days have been having some twitching in her arms. In the ER note jerking movements or seizures were noticed. While going through the med list with patient and her family, patient was noticed to be forgetful about her medications, her daughter-in-law reported that patient has not been taking the medications as prescribed. Patient is supposed to be on OxyContin 60 mg as needed twice a day, patient cannot remember the last time she took it.    Hospital Course:   Seizure-like activity:  -Cannot rule out medication  withdrawal as cause, patient appears to be very forgetful to take her medications. -Admit to neuro telemetry, place on neuro checks, seizure precautions -Seen by neurology, recommended to discontinue Neurontin, continue Lyrica -EEG showed no evidence of seizure focus -No seizure-like activity while she is in the hospital. -Discontinued OxyContin and Neurontin.  Acute renal failure -Patient presented with creatinine of 1.99, presumed acute, baseline creatinine is unknown. -Creatinine improved to 1.17 from 1.99 after IV fluid hydration.  Diabetes mellitus without complication - Hemoglobin A1c of 12.5, correlate with me plasma glucose of 312. - Patient is on metformin, according to the hemoglobin she probably needs insulin. - Overall patient condition and her forgetfulness about medications I think she is better off insulin. - Restarted metformin at 1000 mg twice a day, added Tradjenta 5 mg daily.  Hyperlipidemia -Hold statins for now, check CK to rule out rhabdomyolysis  Hypertension - BP currently stable, hold lisinopril and HCTZ  GERD - Placed on PPI  Chronic pain syndrome, arthritis -Placed on oral oxycodone as needed, IV Dilaudid for severe pain.  -Patient would benefit from pain clinic management for chronic arthritis pain being on high doses of narcotics in light of forgetfulness and dementia. This is been discussed this with the patient's family to bring it up to their PCPs attention -Seen by PT/OT and recommended home health services.  Hyperkalemia Mild hyperkalemia, thought initially to be secondary to acute renal failure. Potassium did not go down with IV fluid hydration, on the day of discharge patient received Late, lisinopril does cut in half.  Procedures:  EEG, showed no acute   Consultations:  Neurology  Discharge Exam: Filed Vitals:  06/06/14 0850  BP: 148/79  Pulse: 65  Temp: 98.2 F (36.8 C)  Resp: 16   General: Alert and awake, oriented x3, not  in any acute distress. HEENT: anicteric sclera, pupils reactive to light and accommodation, EOMI CVS: S1-S2 clear, no murmur rubs or gallops Chest: clear to auscultation bilaterally, no wheezing, rales or rhonchi Abdomen: soft nontender, nondistended, normal bowel sounds, no organomegaly Extremities: no cyanosis, clubbing or edema noted bilaterally Neuro: Cranial nerves II-XII intact, no focal neurological deficits  Discharge Instructions   Discharge Instructions    Diet - low sodium heart healthy    Complete by:  As directed      Increase activity slowly    Complete by:  As directed           Current Discharge Medication List    START taking these medications   Details  HYDROcodone-acetaminophen (NORCO) 5-325 MG per tablet Take 1 tablet by mouth every 6 (six) hours as needed for moderate pain. Qty: 30 tablet, Refills: 0    linagliptin (TRADJENTA) 5 MG TABS tablet Take 1 tablet (5 mg total) by mouth daily. Qty: 30 tablet, Refills: 0      CONTINUE these medications which have CHANGED   Details  lisinopril (PRINIVIL,ZESTRIL) 20 MG tablet Take 2 tablets (40 mg total) by mouth daily. Qty: 30 tablet, Refills: 0    metFORMIN (GLUCOPHAGE) 1000 MG tablet Take 0.5 tablets (500 mg total) by mouth 2 (two) times daily with a meal. Qty: 60 tablet, Refills: 0      CONTINUE these medications which have NOT CHANGED   Details  diphenhydrAMINE (SOMINEX) 25 MG tablet Take 25 mg by mouth at bedtime as needed for allergies.    ferrous sulfate 325 (65 FE) MG tablet Take 325 mg by mouth daily with breakfast.    hydrochlorothiazide (HYDRODIURIL) 25 MG tablet Take 25 mg by mouth daily.     lansoprazole (PREVACID) 30 MG capsule Take 30 mg by mouth daily at 12 noon.     LORazepam (ATIVAN) 0.5 MG tablet Take 0.5 mg by mouth every 6 (six) hours as needed for anxiety.     LYRICA 50 MG capsule Take 50 mg by mouth 2 (two) times daily.    pravastatin (PRAVACHOL) 80 MG tablet Take 40 mg by mouth  daily.     propylthiouracil (PTU) 50 MG tablet Take 50 mg by mouth 2 (two) times daily.    silver sulfADIAZINE (SILVADENE) 1 % cream Apply 1 application topically daily. 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    Significant Diagnostic Studies: Dg Lumbar Spine Complete  06/04/2014   CLINICAL DATA:  Fall, confusion  EXAM: LUMBAR SPINE - COMPLETE 4+ VIEW  COMPARISON:  None  FINDINGS: Five views of the lumbar spine submitted. No acute fracture or subluxation. Mild disc space flattening at L5-S1 level. Mild anterior spurring at T12-L1 and L1-L2 level. Mild anterior spurring upper endplate of L3 vertebral body. Facet degenerative changes at L4 and L5 level.  IMPRESSION: No acute fracture or subluxation. Degenerative changes  as described above.   Electronically Signed   By: Natasha Mead M.D.   On: 06/04/2014 15:05   Dg Sacrum/coccyx  06/04/2014   CLINICAL DATA:  Fall, altered mental status  EXAM: SACRUM AND COCCYX - 2+ VIEW  COMPARISON:  01/29/2014  FINDINGS: There is no evidence of fracture. Pelvic phleboliths are again noted. Surgical clips are noted in right lower quadrant.  IMPRESSION: Negative.   Electronically Signed   By: Natasha Mead M.D.   On: 06/04/2014 15:06   Ct Head Wo Contrast  06/04/2014   CLINICAL DATA:  Increasing confusion  EXAM: CT HEAD WITHOUT CONTRAST  TECHNIQUE: Contiguous axial images were obtained from the base of the skull through the vertex without intravenous contrast.  COMPARISON:  None.  FINDINGS: The bony calvarium is intact. No findings to suggest acute hemorrhage, acute infarction or  space-occupying mass lesion are noted.  IMPRESSION: No acute intracranial abnormality is noted.   Electronically Signed   By: Alcide Clever M.D.   On: 06/04/2014 16:12    Microbiology: Recent Results (from the past 240 hour(s))  Urine culture     Status: None   Collection Time: 06/04/14  8:26 PM  Result Value Ref Range Status   Specimen Description URINE, CLEAN CATCH  Final   Special Requests NONE  Final   Colony Count NO GROWTH Performed at [facility]   Final   Culture NO GROWTH Performed at [facility]   Final   Report Status 06/06/2014 FINAL  Final     Labs: Basic Metabolic Panel:  Recent Labs Lab 06/04/14 1325 06/05/14 0433 06/06/14 0243  NA 135 133* 136  K 5.7* 5.2* 5.3*  CL 100 100 103  CO2 GLUCOSE 303* 232* 263*  BUN 29* 23 19  CREATININE 1.99* 1.31* 1.17*  CALCIUM 9.2 8.9 8.7   Liver Function Tests:  Recent Labs Lab 06/04/14 1325  AST 16  ALT 12  ALKPHOS 54  BILITOT 0.8  PROT 7.7  ALBUMIN 3.6   No results for input(s): LIPASE, AMYLASE in the last 168 hours.  Recent Labs Lab 06/04/14 2100  AMMONIA <9*   CBC:  Recent Labs Lab 06/04/14 1325 06/05/14 0433  WBC 8.7 5.9  NEUTROABS 7.2  --   HGB 12.5 11.7*  HCT 37.9 35.4*  MCV 94.3 92.2  PLT 214 215   Cardiac Enzymes:  Recent Labs Lab 06/04/14 1724  CKTOTAL 321*   BNP: BNP (last 3 results) No results for input(s): BNP in the last 8760 hours.  ProBNP (last 3 results) No results for input(s): PROBNP in the last 8760 hours.  CBG:  Recent Labs Lab 06/05/14 0747 06/05/14 1144 06/05/14 1726 06/05/14 2236 06/06/14 0728  GLUCAP 275* 209* 282* 192* 194*       Signed:  Hydia Copelin A  [facility] 06/06/2014, 11:29 AM

## 2014-06-06 NOTE — Progress Notes (Signed)
Late entry for missed gcode 06/05/14:   06/06/14 1400  PT G-Codes **NOT FOR INPATIENT CLASS**  Functional Assessment Tool Used clinical judgment  Functional Limitation Mobility: Walking and moving around  Mobility: Walking and Moving Around Current Status ([id number](G8978) CJ  Mobility: Walking and Moving Around Goal Status [phone](G8979) CI  [facility] Domenico Achord,PT Acute Rehabilitation [phone] (pager)

## 2014-06-06 NOTE — Progress Notes (Signed)
Physical Therapy Treatment Patient Details Name: Alejandra Espinoza MRN: [id number] DOB: December 22, 1940 Today's Date: 06/06/2014    History of Present Illness Pt is a 74 y.o. Female admitted 06/04/14 with seizure-like activity. Pt had sudden jerking movements resulting in a fall at home. Pt has been forgetful in the past few months and has been having some twitching in her UEs. PMH: OA, DM, HTN, HL.     PT Comments    Pt admitted with above diagnosis. Pt currently with functional limitations due to balance and endurance deficits. Plan is for pt to go home with son and husbands 24 hour care and will use RW intiially.  HHPT to f/u.   Pt will benefit from skilled PT to increase their independence and safety with mobility to allow discharge to the venue listed below.    Follow Up Recommendations  Home health PT;Supervision/Assistance - 24 hour     Equipment Recommendations  None recommended by PT    Recommendations for Other Services       Precautions / Restrictions Precautions Precautions: Fall Restrictions Weight Bearing Restrictions: No    Mobility  Bed Mobility Overal bed mobility: Needs Assistance Bed Mobility: Supine to Sit     Supine to sit: Min assist     General bed mobility comments: Pt needed assist for LEs to sit up.    Transfers Overall transfer level: Needs assistance Equipment used: Rolling walker (2 wheeled) Transfers: Sit to/from [employer] Sit to Stand: Min assist Stand pivot transfers: Min assist       General transfer comment: Pt needed min assist to power up and steadying assist to turn and get on 3N1.  Needed total assist to clean pts bottom as well.    Ambulation/Gait Ambulation/Gait assistance: Min assist;Min guard Ambulation Distance (Feet): 170 Feet Assistive device: Rolling walker (2 wheeled) Gait Pattern/deviations: Step-to pattern;Decreased stride length   Gait velocity interpretation: Below normal speed for age/gender General  Gait Details: Pt was steadier overall today.  Not as impulsive with better overall safety with RW.  Still needed guard asssit but was ambulating better.     Stairs            Wheelchair Mobility    Modified Rankin (Stroke Patients Only)       Balance Overall balance assessment: Needs assistance         Standing balance support: Bilateral upper extremity supported;During functional activity Standing balance-Leahy Scale: Poor Standing balance comment: Relies on RW for balance.               High level balance activites: Direction changes;Turns;Sudden stops High Level Balance Comments: min guard assist needed due to slightly unsteady    Cognition Arousal/Alertness: Awake/alert Behavior During Therapy: WFL for tasks assessed/performed Overall Cognitive Status: Within Functional Limits for tasks assessed       Memory: Decreased short-term memory              Exercises General Exercises - Lower Extremity Ankle Circles/Pumps: AROM;Both;10 reps;Supine Long Arc Quad: AROM;Both;10 reps;Seated Hip Flexion/Marching: AROM;Both;10 reps;Seated    General Comments        Pertinent Vitals/Pain Pain Assessment: No/denies pain  VSS    Home Living                      Prior Function            PT Goals (current goals can now be found in the care plan section) Progress towards PT goals:  Progressing toward goals    Frequency  Min 3X/week    PT Plan Current plan remains appropriate    Co-evaluation             End of Session Equipment Utilized During Treatment: Gait belt Activity Tolerance: Patient limited by fatigue Patient left: in chair;with call bell/phone within reach;with chair alarm set;with family/visitor present     Time: [id number] PT Time Calculation (min) (ACUTE ONLY): 13 min  Charges:  $Gait Training: 8-22 mins                    G CodesBerline Lopes:      Dymir Neeson F 06/06/2014, 2:04 PM [facility] Shermaine Rivet,PT Acute  Rehabilitation [phone] (pager)

## 2014-06-06 NOTE — Care Management Note (Signed)
    Page 1 of 1   06/06/2014     2:15:44 PM CARE MANAGEMENT NOTE 06/06/2014  Patient:  Alejandra Espinoza,Alejandra Espinoza   Account Number:  [id number]  Date Initiated:  06/06/2014  Documentation initiated by:  GRAVES-BIGELOW,Pavel Gadd  Subjective/Objective Assessment:   Pt admitted for syncope. Pt is living with son.     Action/Plan:   [facility]. Son states he will be home with pt and he will monitor her meds. No services set up at this time.   Anticipated DC Date:  06/06/2014   Anticipated DC Plan:  HOME/SELF CARE      DC Planning Services  CM consult      Choice offered to / List presented to:             Status of service:  Completed, signed off Medicare Important Message given?  NO (If response is "NO", the following Medicare IM given date fields will be blank) Date Medicare IM given:   Medicare IM given by:   Date Additional Medicare IM given:   Additional Medicare IM given by:    Discharge Disposition:  HOME/SELF CARE  Per UR Regulation:  Reviewed for med. necessity/level of care/duration of stay  If discussed at Long Length of Stay Meetings, dates discussed:    Comments:

## 2014-06-06 NOTE — Progress Notes (Signed)
UR completed 

## 2014-06-20 ENCOUNTER — Encounter (HOSPITAL_BASED_OUTPATIENT_CLINIC_OR_DEPARTMENT_OTHER): Payer: Self-pay

## 2014-06-20 ENCOUNTER — Inpatient Hospital Stay (HOSPITAL_BASED_OUTPATIENT_CLINIC_OR_DEPARTMENT_OTHER): Payer: Medicare Other

## 2014-06-20 ENCOUNTER — Inpatient Hospital Stay (HOSPITAL_BASED_OUTPATIENT_CLINIC_OR_DEPARTMENT_OTHER)
Admission: EM | Admit: 2014-06-20 | Discharge: 2014-06-24 | DRG: 682 | Disposition: A | Discharge status: Home / Self Care | Payer: Medicare Other | Attending: Internal Medicine | Admitting: Internal Medicine

## 2014-06-20 DIAGNOSIS — E1165 Type 2 diabetes mellitus with hyperglycemia: Secondary | ICD-10-CM | POA: Diagnosis present

## 2014-06-20 DIAGNOSIS — R740 Nonspecific elevation of levels of transaminase and lactic acid dehydrogenase [LDH]: Secondary | ICD-10-CM | POA: Diagnosis present

## 2014-06-20 DIAGNOSIS — E079 Disorder of thyroid, unspecified: Secondary | ICD-10-CM | POA: Diagnosis present

## 2014-06-20 DIAGNOSIS — K2960 Other gastritis without bleeding: Secondary | ICD-10-CM | POA: Diagnosis present

## 2014-06-20 DIAGNOSIS — E1310 Other specified diabetes mellitus with ketoacidosis without coma: Secondary | ICD-10-CM

## 2014-06-20 DIAGNOSIS — K2980 Duodenitis without bleeding: Secondary | ICD-10-CM | POA: Diagnosis present

## 2014-06-20 DIAGNOSIS — E1110 Type 2 diabetes mellitus with ketoacidosis without coma: Secondary | ICD-10-CM | POA: Diagnosis present

## 2014-06-20 DIAGNOSIS — N179 Acute kidney failure, unspecified: Secondary | ICD-10-CM | POA: Diagnosis present

## 2014-06-20 DIAGNOSIS — M1990 Unspecified osteoarthritis, unspecified site: Secondary | ICD-10-CM | POA: Diagnosis present

## 2014-06-20 DIAGNOSIS — K5730 Diverticulosis of large intestine without perforation or abscess without bleeding: Secondary | ICD-10-CM | POA: Diagnosis present

## 2014-06-20 DIAGNOSIS — E871 Hypo-osmolality and hyponatremia: Secondary | ICD-10-CM | POA: Diagnosis present

## 2014-06-20 DIAGNOSIS — R109 Unspecified abdominal pain: Secondary | ICD-10-CM | POA: Diagnosis present

## 2014-06-20 DIAGNOSIS — E785 Hyperlipidemia, unspecified: Secondary | ICD-10-CM | POA: Diagnosis present

## 2014-06-20 DIAGNOSIS — N390 Urinary tract infection, site not specified: Secondary | ICD-10-CM | POA: Diagnosis present

## 2014-06-20 DIAGNOSIS — R1013 Epigastric pain: Secondary | ICD-10-CM

## 2014-06-20 DIAGNOSIS — I129 Hypertensive chronic kidney disease with stage 1 through stage 4 chronic kidney disease, or unspecified chronic kidney disease: Secondary | ICD-10-CM | POA: Diagnosis present

## 2014-06-20 DIAGNOSIS — N183 Chronic kidney disease, stage 3 (moderate): Secondary | ICD-10-CM | POA: Diagnosis present

## 2014-06-20 DIAGNOSIS — I10 Essential (primary) hypertension: Secondary | ICD-10-CM

## 2014-06-20 LAB — HEMOGLOBIN AND HEMATOCRIT, BLOOD
HEMATOCRIT: 36 % (ref 36.0–46.0)
HEMOGLOBIN: 12.5 g/dL (ref 12.0–15.0)

## 2014-06-20 LAB — URINALYSIS, ROUTINE W REFLEX MICROSCOPIC
Bilirubin Urine: NEGATIVE
Ketones, ur: 15 mg/dL — AB
Leukocytes, UA: NEGATIVE
Nitrite: NEGATIVE
PH: 5 (ref 5.0–8.0)
Protein, ur: 100 mg/dL — AB
Specific Gravity, Urine: 1.023 (ref 1.005–1.030)
Urobilinogen, UA: 0.2 mg/dL (ref 0.0–1.0)

## 2014-06-20 LAB — COMPREHENSIVE METABOLIC PANEL
ALT: 226 U/L — AB (ref 0–35)
AST: 45 U/L — AB (ref 0–37)
Albumin: 4.1 g/dL (ref 3.5–5.2)
Alkaline Phosphatase: 89 U/L (ref 39–117)
Anion gap: 20 — ABNORMAL HIGH (ref 5–15)
BILIRUBIN TOTAL: 1.7 mg/dL — AB (ref 0.3–1.2)
BUN: 50 mg/dL — ABNORMAL HIGH (ref 6–23)
CHLORIDE: 89 mmol/L — AB (ref 96–112)
CO2: 20 mmol/L (ref 19–32)
CREATININE: 1.62 mg/dL — AB (ref 0.50–1.10)
Calcium: 9.1 mg/dL (ref 8.4–10.5)
GFR, EST AFRICAN AMERICAN: 36 mL/min — AB (ref >90)
GFR, EST NON AFRICAN AMERICAN: 31 mL/min — AB (ref >90)
Glucose, Bld: 508 mg/dL — ABNORMAL HIGH (ref 70–99)
Potassium: 4.9 mmol/L (ref 3.5–5.1)
Sodium: 129 mmol/L — ABNORMAL LOW (ref 135–145)
Total Protein: 8.9 g/dL — ABNORMAL HIGH (ref 6.0–8.3)

## 2014-06-20 LAB — CBC WITH DIFFERENTIAL/PLATELET
Basophils Absolute: 0 10*3/uL (ref 0.0–0.1)
Basophils Relative: 0 % (ref 0–1)
EOS ABS: 0 10*3/uL (ref 0.0–0.7)
Eosinophils Relative: 0 % (ref 0–5)
HEMATOCRIT: 41 % (ref 36.0–46.0)
HEMOGLOBIN: 13.9 g/dL (ref 12.0–15.0)
LYMPHS PCT: 6 % — AB (ref 12–46)
Lymphs Abs: 0.8 10*3/uL (ref 0.7–4.0)
MCH: 30.6 pg (ref 26.0–34.0)
MCHC: 33.9 g/dL (ref 30.0–36.0)
MCV: 90.3 fL (ref 78.0–100.0)
Monocytes Absolute: 0.7 10*3/uL (ref 0.1–1.0)
Monocytes Relative: 5 % (ref 3–12)
Neutro Abs: 12.9 10*3/uL — ABNORMAL HIGH (ref 1.7–7.7)
Neutrophils Relative %: 89 % — ABNORMAL HIGH (ref 43–77)
PLATELETS: 191 10*3/uL (ref 150–400)
RBC: 4.54 MIL/uL (ref 3.87–5.11)
RDW: 12.8 % (ref 11.5–15.5)
WBC: 14.4 10*3/uL — ABNORMAL HIGH (ref 4.0–10.5)

## 2014-06-20 LAB — HEPATIC FUNCTION PANEL
ALBUMIN: 3.1 g/dL — AB (ref 3.5–5.2)
ALK PHOS: 73 U/L (ref 39–117)
ALT: 154 U/L — AB (ref 0–35)
AST: 24 U/L (ref 0–37)
Bilirubin, Direct: 0.3 mg/dL (ref 0.0–0.5)
Indirect Bilirubin: 0.7 mg/dL (ref 0.3–0.9)
Total Bilirubin: 1 mg/dL (ref 0.3–1.2)
Total Protein: 7.5 g/dL (ref 6.0–8.3)

## 2014-06-20 LAB — BASIC METABOLIC PANEL
ANION GAP: 12 (ref 5–15)
BUN: 34 mg/dL — AB (ref 6–23)
CHLORIDE: 100 mmol/L (ref 96–112)
CO2: 23 mmol/L (ref 19–32)
Calcium: 8.6 mg/dL (ref 8.4–10.5)
Creatinine, Ser: 1.21 mg/dL — ABNORMAL HIGH (ref 0.50–1.10)
GFR calc Af Amer: 51 mL/min — ABNORMAL LOW (ref >90)
GFR calc non Af Amer: 44 mL/min — ABNORMAL LOW (ref >90)
Glucose, Bld: 111 mg/dL — ABNORMAL HIGH (ref 70–99)
POTASSIUM: 3.7 mmol/L (ref 3.5–5.1)
Sodium: 135 mmol/L (ref 135–145)

## 2014-06-20 LAB — CBG MONITORING, ED
GLUCOSE-CAPILLARY: 207 mg/dL — AB (ref 70–99)
Glucose-Capillary: 490 mg/dL — ABNORMAL HIGH (ref 70–99)
Glucose-Capillary: 423 mg/dL — ABNORMAL HIGH (ref 70–99)
Glucose-Capillary: 420 mg/dL — ABNORMAL HIGH (ref 70–99)
Glucose-Capillary: 389 mg/dL — ABNORMAL HIGH (ref 70–99)
Glucose-Capillary: 220 mg/dL — ABNORMAL HIGH (ref 70–99)
Glucose-Capillary: 228 mg/dL — ABNORMAL HIGH (ref 70–99)
Glucose-Capillary: 234 mg/dL — ABNORMAL HIGH (ref 70–99)
Glucose-Capillary: 171 mg/dL — ABNORMAL HIGH (ref 70–99)

## 2014-06-20 LAB — URINE MICROSCOPIC-ADD ON

## 2014-06-20 LAB — LIPASE, BLOOD: Lipase: 39 U/L (ref 11–59)

## 2014-06-20 LAB — TSH: TSH: 0.124 u[IU]/mL — ABNORMAL LOW (ref 0.350–4.500)

## 2014-06-20 LAB — GLUCOSE, CAPILLARY: Glucose-Capillary: 152 mg/dL — ABNORMAL HIGH (ref 70–99)

## 2014-06-20 MED ORDER — FENTANYL CITRATE 0.05 MG/ML IJ SOLN
50.0000 ug | Freq: Once | INTRAMUSCULAR | Status: AC
  Administered 2014-06-20: 50 ug via INTRAVENOUS
  Filled 2014-06-20: qty 2

## 2014-06-20 MED ORDER — INSULIN ASPART 100 UNIT/ML ~~LOC~~ SOLN
0.0000 [IU] | Freq: Every day | SUBCUTANEOUS | Status: DC
  Administered 2014-06-21: 3 [IU] via SUBCUTANEOUS
  Administered 2014-06-23: 5 [IU] via SUBCUTANEOUS

## 2014-06-20 MED ORDER — HYDROMORPHONE HCL 1 MG/ML IJ SOLN
0.5000 mg | INTRAMUSCULAR | Status: DC | PRN
  Administered 2014-06-20: 1 mg via INTRAVENOUS
  Filled 2014-06-20: qty 1

## 2014-06-20 MED ORDER — SODIUM CHLORIDE 0.9 % IV SOLN: 250.0000 mL | INTRAVENOUS | Status: DC | PRN

## 2014-06-20 MED ORDER — SODIUM CHLORIDE 0.9 % IV BOLUS (SEPSIS): 500.0000 mL | Freq: Once | INTRAVENOUS | Status: DC

## 2014-06-20 MED ORDER — SODIUM CHLORIDE 0.9 % IV BOLUS (SEPSIS)
500.0000 mL | Freq: Once | INTRAVENOUS | Status: AC
  Administered 2014-06-20: 500 mL via INTRAVENOUS

## 2014-06-20 MED ORDER — INSULIN ASPART 100 UNIT/ML ~~LOC~~ SOLN
0.0000 [IU] | Freq: Three times a day (TID) | SUBCUTANEOUS | Status: DC
  Administered 2014-06-21: 3 [IU] via SUBCUTANEOUS
  Administered 2014-06-21 – 2014-06-22 (×4): 5 [IU] via SUBCUTANEOUS
  Administered 2014-06-22: 2 [IU] via SUBCUTANEOUS
  Administered 2014-06-23 (×2): 3 [IU] via SUBCUTANEOUS
  Administered 2014-06-23 – 2014-06-24 (×2): 5 [IU] via SUBCUTANEOUS
  Administered 2014-06-24: 3 [IU] via SUBCUTANEOUS

## 2014-06-20 MED ORDER — SODIUM CHLORIDE 0.9 % IJ SOLN
3.0000 mL | Freq: Two times a day (BID) | INTRAMUSCULAR | Status: DC
  Administered 2014-06-20 – 2014-06-24 (×5): 3 mL via INTRAVENOUS

## 2014-06-20 MED ORDER — SODIUM CHLORIDE 0.9 % IV SOLN
1000.0000 mL | INTRAVENOUS | Status: DC
  Administered 2014-06-20: 1000 mL via INTRAVENOUS

## 2014-06-20 MED ORDER — SODIUM CHLORIDE 0.9 % IV SOLN
INTRAVENOUS | Status: DC
  Administered 2014-06-20: 3.6 [IU]/h via INTRAVENOUS

## 2014-06-20 MED ORDER — PANTOPRAZOLE SODIUM 40 MG IV SOLR
40.0000 mg | Freq: Once | INTRAVENOUS | Status: AC
  Administered 2014-06-20: 40 mg via INTRAVENOUS
  Filled 2014-06-20: qty 40

## 2014-06-20 MED ORDER — ACETAMINOPHEN 325 MG PO TABS
650.0000 mg | ORAL_TABLET | Freq: Once | ORAL | Status: AC
  Administered 2014-06-20: 650 mg via ORAL
  Filled 2014-06-20: qty 2

## 2014-06-20 MED ORDER — ACETAMINOPHEN 325 MG PO TABS
650.0000 mg | ORAL_TABLET | Freq: Four times a day (QID) | ORAL | Status: DC | PRN
  Administered 2014-06-20 – 2014-06-23 (×4): 650 mg via ORAL
  Filled 2014-06-20 (×4): qty 2

## 2014-06-20 MED ORDER — ONDANSETRON HCL 4 MG PO TABS: 4.0000 mg | ORAL_TABLET | Freq: Four times a day (QID) | ORAL | Status: DC | PRN

## 2014-06-20 MED ORDER — ONDANSETRON HCL 4 MG/2ML IJ SOLN
4.0000 mg | Freq: Four times a day (QID) | INTRAMUSCULAR | Status: DC | PRN
  Administered 2014-06-21: 4 mg via INTRAVENOUS
  Filled 2014-06-20: qty 2

## 2014-06-20 MED ORDER — DEXTROSE-NACL 5-0.45 % IV SOLN: INTRAVENOUS | Status: DC

## 2014-06-20 MED ORDER — POTASSIUM CHLORIDE 10 MEQ/100ML IV SOLN
10.0000 meq | INTRAVENOUS | Status: AC
  Administered 2014-06-20 (×2): 10 meq via INTRAVENOUS
  Filled 2014-06-20 (×2): qty 100

## 2014-06-20 MED ORDER — SODIUM CHLORIDE 0.9 % IV SOLN
INTRAVENOUS | Status: DC
  Filled 2014-06-20: qty 2.5

## 2014-06-20 MED ORDER — DEXTROSE-NACL 5-0.45 % IV SOLN
INTRAVENOUS | Status: DC
  Administered 2014-06-20: 15:00:00 via INTRAVENOUS

## 2014-06-20 MED ORDER — ALUM & MAG HYDROXIDE-SIMETH 200-200-20 MG/5ML PO SUSP: 30.0000 mL | Freq: Four times a day (QID) | ORAL | Status: DC | PRN

## 2014-06-20 MED ORDER — INSULIN GLARGINE 100 UNIT/ML ~~LOC~~ SOLN
5.0000 [IU] | Freq: Every day | SUBCUTANEOUS | Status: DC
  Administered 2014-06-20 – 2014-06-21 (×2): 5 [IU] via SUBCUTANEOUS
  Filled 2014-06-20 (×3): qty 0.05

## 2014-06-20 MED ORDER — ACETAMINOPHEN 650 MG RE SUPP: 650.0000 mg | Freq: Four times a day (QID) | RECTAL | Status: DC | PRN

## 2014-06-20 MED ORDER — ENOXAPARIN SODIUM 30 MG/0.3ML ~~LOC~~ SOLN
30.0000 mg | SUBCUTANEOUS | Status: DC
  Administered 2014-06-21: 30 mg via SUBCUTANEOUS
  Filled 2014-06-20 (×2): qty 0.3

## 2014-06-20 MED ORDER — OXYCODONE HCL 5 MG PO TABS
5.0000 mg | ORAL_TABLET | ORAL | Status: DC | PRN
  Administered 2014-06-21 – 2014-06-24 (×10): 5 mg via ORAL
  Filled 2014-06-20 (×11): qty 1

## 2014-06-20 MED ORDER — SODIUM CHLORIDE 0.9 % IV SOLN: INTRAVENOUS | Status: DC

## 2014-06-20 MED ORDER — SODIUM CHLORIDE 0.9 % IV SOLN
INTRAVENOUS | Status: DC
  Administered 2014-06-21: via INTRAVENOUS

## 2014-06-20 MED ORDER — SODIUM CHLORIDE 0.9 % IJ SOLN: 3.0000 mL | INTRAMUSCULAR | Status: DC | PRN

## 2014-06-20 MED ORDER — ONDANSETRON HCL 4 MG/2ML IJ SOLN
4.0000 mg | Freq: Once | INTRAMUSCULAR | Status: AC
  Administered 2014-06-20: 4 mg via INTRAVENOUS
  Filled 2014-06-20: qty 2

## 2014-06-20 MED ORDER — IOHEXOL 300 MG/ML  SOLN
25.0000 mL | Freq: Once | INTRAMUSCULAR | Status: AC | PRN
  Administered 2014-06-20: 25 mL via ORAL

## 2014-06-20 MED ORDER — PANTOPRAZOLE SODIUM 40 MG IV SOLR
40.0000 mg | Freq: Two times a day (BID) | INTRAVENOUS | Status: DC
Start: 2014-06-20 — End: 2014-06-24
  Administered 2014-06-20 – 2014-06-24 (×8): 40 mg via INTRAVENOUS
  Filled 2014-06-20 (×9): qty 40

## 2014-06-20 NOTE — ED Notes (Signed)
Contacted [facility] Sandyavid De[facility]. Patients son about disposition with room number.

## 2014-06-20 NOTE — Progress Notes (Signed)
Patient presents with abdominal pain, found to be in DKA. Accepted to step down unit. CT abdomen with oral contrast will be ordered.  Alejandra BarefootBelkys Cylinda Santoli, MD.

## 2014-06-20 NOTE — ED Notes (Signed)
Per [facility]: Pt reports abdominal pain all over x 3 days. Also reports hyperglycemia at 497 x 2 days. Reports vomiting x 1. Poor intake. 93% on RA 160/100, 100,

## 2014-06-20 NOTE — ED Provider Notes (Signed)
CSN: [id number]     Arrival date & time 06/20/14  2725 History   First MD Initiated Contact with Patient 06/20/14 [phone]     Chief Complaint  Patient presents with  . Abdominal Pain  . Hyperglycemia    Level V caveat due to altered mental status. (Consider location/radiation/quality/duration/timing/severity/associated sxs/prior Treatment) Patient is a 74 y.o. female presenting with abdominal pain and hyperglycemia. The history is provided by the patient.  Abdominal Pain Pain location:  Generalized Associated symptoms: vomiting   Associated symptoms: no fever   Hyperglycemia Associated symptoms: abdominal pain and vomiting   Associated symptoms: no fever    patient presents with abdominal pain and high blood sugar. Has been going for the last 3 days. Has vomited once. Decreased oral intake. No fevers. Sugars been running 5 or 600. Has had some urinary frequency. Patient is unable to provide much history. Reportedly saw her PCP on Tuesday and is going to get started on insulin. Has had some increased confusion also.  Past Medical History  Diagnosis Date  . Arthritis   . Diabetes mellitus without complication   . Hyperlipidemia   . Hypertension   . Thyroid disease    History reviewed. No pertinent past surgical history. No family history on file. History  Substance Use Topics  . Smoking status: Never Smoker   . Smokeless tobacco: Never Used  . Alcohol Use: No   OB History    No data available     Review of Systems  Unable to perform ROS Constitutional: Negative for fever.  Gastrointestinal: Positive for vomiting and abdominal pain.      Allergies  Review of patient's allergies indicates no known allergies.  Home Medications   Prior to Admission medications   Medication Sig Start Date End Date Taking? Authorizing Provider  diphenhydrAMINE (SOMINEX) 25 MG tablet Take 25 mg by mouth at bedtime as needed for allergies.    Historical Provider, MD  ferrous sulfate 325  (65 FE) MG tablet Take 325 mg by mouth daily with breakfast.    Historical Provider, MD  hydrochlorothiazide (HYDRODIURIL) 25 MG tablet Take 25 mg by mouth daily.  03/06/13   Historical Provider, MD  HYDROcodone-acetaminophen (NORCO) 5-325 MG per tablet Take 1 tablet by mouth every 6 (six) hours as needed for moderate pain. 06/06/14   Clydia Llano, MD  lansoprazole (PREVACID) 30 MG capsule Take 30 mg by mouth daily at 12 noon.  05/14/13   Historical Provider, MD  linagliptin (TRADJENTA) 5 MG TABS tablet Take 1 tablet (5 mg total) by mouth daily. 06/06/14   Clydia Llano, MD  lisinopril (PRINIVIL,ZESTRIL) 20 MG tablet Take 2 tablets (40 mg total) by mouth daily. 06/06/14   Clydia Llano, MD  LORazepam (ATIVAN) 0.5 MG tablet Take 0.5 mg by mouth every 6 (six) hours as needed for anxiety.  03/06/13   Historical Provider, MD  LYRICA 50 MG capsule Take 50 mg by mouth 2 (two) times daily. 05/13/14   Historical Provider, MD  metFORMIN (GLUCOPHAGE) 1000 MG tablet Take 0.5 tablets (500 mg total) by mouth 2 (two) times daily with a meal. 06/06/14   Clydia Llano, MD  pravastatin (PRAVACHOL) 80 MG tablet Take 40 mg by mouth daily.  03/06/13   Historical Provider, MD  propylthiouracil (PTU) 50 MG tablet Take 50 mg by mouth 2 (two) times daily.    Historical Provider, MD  silver sulfADIAZINE (SILVADENE) 1 % cream Apply 1 application topically daily. 02/03/14   Carrington Clamp, DPM  BP 157/79 mmHg  Pulse 89  Temp(Src) 98.7 F (37.1 C) (Oral)  Resp 18  Ht  (1.626 m)  Wt 175 lb (79.379 kg)  BMI 30.02 kg/m2  SpO2 95% Physical Exam  Constitutional: She appears well-developed.  HENT:  Right Ear: External ear normal.  Eyes: Pupils are equal, round, and reactive to light.  Cardiovascular: Regular rhythm.   Mild tachycardia  Pulmonary/Chest: Effort normal.  Abdominal: There is tenderness.  Diffuse tenderness, worse in right lower quadrant.  Musculoskeletal: Normal range of motion.  Neurological: She is alert.   Mild apparent confusion and patient is a somewhat poor historian. Most questions answered by family members.  Skin: Skin is warm.  Nursing note and vitals reviewed.   ED Course  Procedures (including critical care time) Labs Review Labs Reviewed  COMPREHENSIVE METABOLIC PANEL - Abnormal; Notable for the following:    Sodium 129 (*)    Chloride 89 (*)    Glucose, Bld 508 (*)    BUN 50 (*)    Creatinine, Ser 1.62 (*)    Total Protein 8.9 (*)    AST 45 (*)    ALT 226 (*)    Total Bilirubin 1.7 (*)    GFR calc non Af Amer 31 (*)    GFR calc Af Amer 36 (*)    Anion gap 20 (*)    All other components within normal limits  URINALYSIS, ROUTINE W REFLEX MICROSCOPIC - Abnormal; Notable for the following:    APPearance CLOUDY (*)    Glucose, UA >1000 (*)    Hgb urine dipstick MODERATE (*)    Ketones, ur 15 (*)    Protein, ur 100 (*)    All other components within normal limits  CBC WITH DIFFERENTIAL/PLATELET - Abnormal; Notable for the following:    WBC 14.4 (*)    Neutrophils Relative % 89 (*)    Neutro Abs 12.9 (*)    Lymphocytes Relative 6 (*)    All other components within normal limits  CBG MONITORING, ED - Abnormal; Notable for the following:    Glucose-Capillary 490 (*)    All other components within normal limits  CBG MONITORING, ED - Abnormal; Notable for the following:    Glucose-Capillary 423 (*)    All other components within normal limits  CBG MONITORING, ED - Abnormal; Notable for the following:    Glucose-Capillary 420 (*)    All other components within normal limits  CBG MONITORING, ED - Abnormal; Notable for the following:    Glucose-Capillary 389 (*)    All other components within normal limits  CBG MONITORING, ED - Abnormal; Notable for the following:    Glucose-Capillary 220 (*)    All other components within normal limits  CBG MONITORING, ED - Abnormal; Notable for the following:    Glucose-Capillary 228 (*)    All other components within normal limits   LIPASE, BLOOD  URINE MICROSCOPIC-ADD ON    Imaging Review Ct Abdomen Pelvis Wo Contrast  06/20/2014   CLINICAL DATA:  Abdominal pain all over for 3 days.  Vomiting.  EXAM: CT ABDOMEN AND PELVIS WITHOUT CONTRAST  TECHNIQUE: Multidetector CT imaging of the abdomen and pelvis was performed following the standard protocol without IV contrast.  COMPARISON:  None.  FINDINGS: BODY WALL: No contributory findings.  LOWER CHEST: Moderate sliding hiatal hernia.  ABDOMEN/PELVIS:  Liver: No focal abnormality.  Biliary: Cholecystectomy.  Pancreas: Unremarkable.  Spleen: Unremarkable.  Adrenals: Unremarkable.  Kidneys and ureters: No hydronephrosis or  stone.  Bladder: Unremarkable.  Reproductive: Hysterectomy.  No pathologic findings.  Bowel: Marked thickening of the gastric antrum and proximal duodenum with surrounding fat infiltration. There is a gas-filled channel along the anterior aspect of the first second portion duodenum compatible with ulcer. No evidence for abscess or pneumoperitoneum. Appendectomy. There is extensive colonic diverticulosis. No bowel obstruction.  Retroperitoneum: No mass or adenopathy.  Peritoneum: No ascites or pneumoperitoneum.  Vascular: No acute abnormality.  OSSEOUS: No acute abnormalities.  IMPRESSION: 1. Antritis and proximal duodenitis with probable duodenal ulcer. After treatment, recommend imaging or endoscopic follow-up to ensure normalization. 2. Moderate hiatal hernia. 3. Colonic diverticulosis.   Electronically Signed   By: Marnee SpringJonathon  Watts M.D.   On: 06/20/2014 14:27     EKG Interpretation None      MDM   Final diagnoses:  Diabetic ketoacidosis without coma associated with type 2 diabetes mellitus  Abdominal pain    Patient with abdominal pain and DKA. Lab work shows mildly elevated creatinine and hyperglycemia with an anion gap. Started on insulin drip. [facility]. D/w Dr. Sunnie Nielsenegalado, who accepted the patient transfer. CT scan done without  contrast showed blood nidus and duodenal ulcer. Given IV Protonix here. Sugar has come down somewhat concerned on the D5 half drip.  CRITICAL CARE Performed by: Billee CashingPICKERING,Karlisha Mathena R. Total critical care time: 30 Critical care time was exclusive of separately billable procedures and treating other patients. Critical care was necessary to treat or prevent imminent or life-threatening deterioration. Critical care was time spent personally by me on the following activities: development of treatment plan with patient and/or surrogate as well as nursing, discussions with consultants, evaluation of patient's response to treatment, examination of patient, obtaining history from patient or surrogate, ordering and performing treatments and interventions, ordering and review of laboratory studies, ordering and review of radiographic studies, pulse oximetry and re-evaluation of patient's condition.    Benjiman CoreNathan Travis Mastel, MD 06/20/14 1534

## 2014-06-20 NOTE — H&P (Signed)
[facility] Admission History and Physical       Alejandra Espinoza OZH:[id number] DOB: 1940/07/01 DOA: 06/20/2014  Referring physician: EDP PCP: Michiel SitesKOHUT,WALTER DENNIS, MD  Specialists:   Chief Complaint: ABD Pain  HPI: Alejandra Espinoza is a 74 y.o. female with history of DM2, HTN, Hyperlipidemia who [facility] due to complaints of increased ABD pain for the past few days, she denies having vomiting, hematemesis, or melena, or hematochezia or diarrhea.  She was found to have DKA, and ARF in the ED, and a CT scan of the ABD was performed which revealed Antrumitis and Duodenitis.  [facility].     Review of Systems:  Constitutional: No Weight Loss, No Weight Gain, Night Sweats, Fevers, Chills, Dizziness, Light Headedness, Fatigue, or Generalized Weakness HEENT: No Headaches, Difficulty Swallowing,Tooth/Dental Problems,Sore Throat,  No Sneezing, Rhinitis, Ear Ache, Nasal Congestion, or Post Nasal Drip,  Cardio-vascular:  No Chest pain, Orthopnea, PND, Edema in Lower Extremities, Anasarca, Dizziness, Palpitations  Resp: No Dyspnea, No DOE, No Productive Cough, No Non-Productive Cough, No Hemoptysis, No Wheezing.    GI: No Heartburn, Indigestion, +Abdominal Pain, Nausea, Vomiting, Diarrhea, Constipation, Hematemesis, Hematochezia, Melena, Change in Bowel Habits,  Loss of Appetite  GU: No Dysuria, No Change in Color of Urine, No Urgency or Urinary Frequency, No Flank pain.  Musculoskeletal: No Joint Pain or Swelling, No Decreased Range of Motion, No Back Pain.  Neurologic: No Syncope, No Seizures, Muscle Weakness, Paresthesia, Vision Disturbance or Loss, No Diplopia, No Vertigo, No Difficulty Walking,  Skin: No Rash or Lesions. Psych: No Change in Mood or Affect, No Depression or Anxiety, No Memory loss, No Confusion, or Hallucinations   Past Medical History  Diagnosis Date  . Arthritis   . Diabetes mellitus without complication   .  Hyperlipidemia   . Hypertension   . Thyroid disease      History reviewed. No pertinent past surgical history.    Prior to Admission medications   Medication Sig Start Date End Date Taking? Authorizing Provider  ACCU-CHEK AVIVA PLUS test strip 1 each by Other route 2 (two) times daily.  06/06/14  Yes Historical Provider, MD  acetaminophen (TYLENOL) 500 MG tablet Take 500 mg by mouth every 6 (six) hours as needed for mild pain or moderate pain.   Yes Historical Provider, MD  hydrochlorothiazide (HYDRODIURIL) 25 MG tablet Take 25 mg by mouth daily.  03/06/13  Yes Historical Provider, MD  HYDROcodone-acetaminophen (NORCO) 5-325 MG per tablet Take 1 tablet by mouth every 6 (six) hours as needed for moderate pain. 06/06/14  Yes Mutaz Elmahi, MD  JANUVIA 50 MG tablet Take 50 mg by mouth daily.  06/06/14  Yes Historical Provider, MD  lisinopril (PRINIVIL,ZESTRIL) 20 MG tablet Take 2 tablets (40 mg total) by mouth daily. 06/06/14  Yes Clydia LlanoMutaz Elmahi, MD  metFORMIN (GLUCOPHAGE) 1000 MG tablet Take 0.5 tablets (500 mg total) by mouth 2 (two) times daily with a meal. 06/06/14  Yes Clydia LlanoMutaz Elmahi, MD  propylthiouracil (PTU) 50 MG tablet Take 50 mg by mouth 2 (two) times daily.   Yes Historical Provider, MD  linagliptin (TRADJENTA) 5 MG TABS tablet Take 1 tablet (5 mg total) by mouth daily. Patient not taking: Reported on 06/20/2014 06/06/14   Clydia LlanoMutaz Elmahi, MD  LYRICA 50 MG capsule Take 50 mg by mouth 2 (two) times daily. 05/13/14   Historical Provider, MD  silver sulfADIAZINE (SILVADENE) 1 % cream Apply 1 application topically daily. Patient  not taking: Reported on 06/20/2014 02/03/14   Carrington Clamp, DPM     No Known Allergies  Social History:  reports that she has never smoked. She has never used smokeless tobacco. She reports that she does not drink alcohol or use illicit drugs.    No family history on file.     Physical Exam:  GEN:  Pleasant Elderly Well Nourished and Well Developed  74 y.o. female  examined and in no acute distress; cooperative with exam Filed Vitals:   06/20/14 1730 06/20/14 1800 06/20/14 1825 06/20/14 1943  BP: 141/66 144/71 134/65 145/65  Pulse: 95 93 88 85  Temp:    98.7 F (37.1 C)  TempSrc:    Oral  Resp: Height:      Weight:      SpO2: 93% 94% 95% 95%   Blood pressure 145/65, pulse 85, temperature 98.7 F (37.1 C), temperature source Oral, resp. rate 24, height  (1.626 m), weight 79.379 kg (175 lb), SpO2 95 %. PSYCH: She is alert and oriented x4; does not appear anxious does not appear depressed; affect is normal HEENT: Normocephalic and Atraumatic, Mucous membranes pink; PERRLA; EOM intact; Fundi:  Benign;  No scleral icterus, Nares: Patent, Oropharynx: Clear, Fair Dentition,    Neck:  FROM, No Cervical Lymphadenopathy nor Thyromegaly or Carotid Bruit; No JVD; Breasts:: Not examined CHEST WALL: No tenderness CHEST: Normal respiration, clear to auscultation bilaterally HEART: Regular rate and rhythm; no murmurs rubs or gallops BACK: No kyphosis or scoliosis; No CVA tenderness ABDOMEN: Positive Bowel Sounds, Soft Non-Tender, No Rebound or Guarding; No Masses, No Organomegaly, No Pannus; No Intertriginous candida. Rectal Exam: Not done EXTREMITIES: No Cyanosis, Clubbing, or Edema; No Ulcerations. Genitalia: not examined PULSES: 2+ and symmetric SKIN: Normal hydration no rash or ulceration CNS:  Alert and Oriented x 4, No Focal Deficits Vascular: pulses palpable throughout    Labs on Admission:  Basic Metabolic Panel:  Recent Labs Lab 06/20/14 1000  NA 129*  K 4.9  CL 89*  CO2 20  GLUCOSE 508*  BUN 50*  CREATININE 1.62*  CALCIUM 9.1   Liver Function Tests:  Recent Labs Lab 06/20/14 1000  AST 45*  ALT 226*  ALKPHOS 89  BILITOT 1.7*  PROT 8.9*  ALBUMIN 4.1    Recent Labs Lab 06/20/14 1004  LIPASE 39   No results for input(s): AMMONIA in the last 168 hours. CBC:  Recent Labs Lab 06/20/14 1000  WBC 14.4*    NEUTROABS 12.9*  HGB 13.9  HCT 41.0  MCV 90.3  PLT 191   Cardiac Enzymes: No results for input(s): CKTOTAL, CKMB, CKMBINDEX, TROPONINI in the last 168 hours.  BNP (last 3 results) No results for input(s): BNP in the last 8760 hours.  ProBNP (last 3 results) No results for input(s): PROBNP in the last 8760 hours.  CBG:  Recent Labs Lab 06/20/14 1443 06/20/14 1515 06/20/14 1619 06/20/14 1724 06/20/14 1827  GLUCAP 220* 228* 234* 207* 171*    Radiological Exams on Admission: Ct Abdomen Pelvis Wo Contrast  06/20/2014   CLINICAL DATA:  Abdominal pain all over for 3 days.  Vomiting.  EXAM: CT ABDOMEN AND PELVIS WITHOUT CONTRAST  TECHNIQUE: Multidetector CT imaging of the abdomen and pelvis was performed following the standard protocol without IV contrast.  COMPARISON:  None.  FINDINGS: BODY WALL: No contributory findings.  LOWER CHEST: Moderate sliding hiatal hernia.  ABDOMEN/PELVIS:  Liver: No focal abnormality.  Biliary: Cholecystectomy.  Pancreas: Unremarkable.  Spleen: Unremarkable.  Adrenals: Unremarkable.  Kidneys and ureters: No hydronephrosis or stone.  Bladder: Unremarkable.  Reproductive: Hysterectomy.  No pathologic findings.  Bowel: Marked thickening of the gastric antrum and proximal duodenum with surrounding fat infiltration. There is a gas-filled channel along the anterior aspect of the first second portion duodenum compatible with ulcer. No evidence for abscess or pneumoperitoneum. Appendectomy. There is extensive colonic diverticulosis. No bowel obstruction.  Retroperitoneum: No mass or adenopathy.  Peritoneum: No ascites or pneumoperitoneum.  Vascular: No acute abnormality.  OSSEOUS: No acute abnormalities.  IMPRESSION: 1. Antritis and proximal duodenitis with probable duodenal ulcer. After treatment, recommend imaging or endoscopic follow-up to ensure normalization. 2. Moderate hiatal hernia. 3. Colonic diverticulosis.   Electronically Signed   By: Marnee Spring M.D.    On: 06/20/2014 14:27     EKG: Independently reviewed.    Assessment/Plan:   74 y.o. female with  Principal Problem:   1.   AKI (acute kidney injury)   IVFs   Monitor BUN/Cr   Hold Lisinopril and HCTZ   Active Problems:   2.  DKA (diabetic ketoacidosis)   DKA Protocol with IV Insulin   Hold Oral Anti-Hypoglycemics       3.  Abdominal pain- due to Duodenitits     4.  Duodenitis   IV Protonix   Monitor H/Hs   GI Consult in AM     5.  Elevated Transaminases   Monitor LFTs   Check Lipase level     6.  Hyperlipidemia   Not currently on Rx     7.  Hypertension   Hold Lisinopril Rx   Hold HCTz       8.  Thyroid disease   Check TSH Level     9.  DVT Prophylaxis    Lovenox     Code Status:     FULL CODE       Family Communication:   No Family Present    Disposition Plan:    Inpatient  Status        Time spent:  77 Minutes      Ron Parker [facility] Pager [phone]   If 7AM -7PM Please Contact the Day Rounding Team MD for [facility]  If 7PM-7AM, Please Contact Night-Floor Coverage  [email or web address] Password [facility] 06/20/2014, 7:44 PM     ADDENDUM:   Patient was seen and examined on 06/20/2014

## 2014-06-20 NOTE — ED Provider Notes (Signed)
[facility] be [facility].  Patient stable for transfer. Filed Vitals:   06/20/14 1730  BP: 141/66  Pulse: 95  Temp:   Resp: 21     Margarita Grizzleanielle Brolin Dambrosia, MD 06/20/14 [phone]

## 2014-06-20 NOTE — ED Notes (Signed)
Patients family alerted this RN that pt was shaking. Pt assessed/ alert oriented. Shaking due to pain in abdomen and emotions. States verabally that she does not like water and wants something else. Notified EDP. Ok to give Diet Coke.

## 2014-06-20 NOTE — ED Notes (Signed)
PLACED ON HEART MONITOR

## 2014-06-21 LAB — HEPATITIS PANEL, ACUTE
HCV AB: NEGATIVE
HEP A IGM: NONREACTIVE
HEP B C IGM: NONREACTIVE
Hepatitis B Surface Ag: NEGATIVE

## 2014-06-21 LAB — CBC
HCT: 34.9 % — ABNORMAL LOW (ref 36.0–46.0)
Hemoglobin: 12 g/dL (ref 12.0–15.0)
MCH: 30.8 pg (ref 26.0–34.0)
MCHC: 34.4 g/dL (ref 30.0–36.0)
MCV: 89.5 fL (ref 78.0–100.0)
Platelets: 208 10*3/uL (ref 150–400)
RBC: 3.9 MIL/uL (ref 3.87–5.11)
RDW: 13.1 % (ref 11.5–15.5)
WBC: 12.1 10*3/uL — ABNORMAL HIGH (ref 4.0–10.5)

## 2014-06-21 LAB — GLUCOSE, CAPILLARY
GLUCOSE-CAPILLARY: 121 mg/dL — AB (ref 70–99)
GLUCOSE-CAPILLARY: 109 mg/dL — AB (ref 70–99)
Glucose-Capillary: 134 mg/dL — ABNORMAL HIGH (ref 70–99)
Glucose-Capillary: 161 mg/dL — ABNORMAL HIGH (ref 70–99)
Glucose-Capillary: 208 mg/dL — ABNORMAL HIGH (ref 70–99)
Glucose-Capillary: 269 mg/dL — ABNORMAL HIGH (ref 70–99)
Glucose-Capillary: 284 mg/dL — ABNORMAL HIGH (ref 70–99)

## 2014-06-21 LAB — BASIC METABOLIC PANEL
ANION GAP: 10 (ref 5–15)
Anion gap: 9 (ref 5–15)
BUN: 33 mg/dL — AB (ref 6–23)
BUN: 29 mg/dL — ABNORMAL HIGH (ref 6–23)
CALCIUM: 8.5 mg/dL (ref 8.4–10.5)
CALCIUM: 8.4 mg/dL (ref 8.4–10.5)
CO2: 20 mmol/L (ref 19–32)
CO2: 20 mmol/L (ref 19–32)
CREATININE: 1.16 mg/dL — AB (ref 0.50–1.10)
Chloride: 100 mmol/L (ref 96–112)
Chloride: 102 mmol/L (ref 96–112)
Creatinine, Ser: 1.14 mg/dL — ABNORMAL HIGH (ref 0.50–1.10)
GFR calc Af Amer: 54 mL/min — ABNORMAL LOW (ref >90)
GFR calc Af Amer: 55 mL/min — ABNORMAL LOW (ref >90)
GFR calc non Af Amer: 46 mL/min — ABNORMAL LOW (ref >90)
GFR calc non Af Amer: 47 mL/min — ABNORMAL LOW (ref >90)
GLUCOSE: 210 mg/dL — AB (ref 70–99)
Glucose, Bld: 254 mg/dL — ABNORMAL HIGH (ref 70–99)
POTASSIUM: 4.6 mmol/L (ref 3.5–5.1)
POTASSIUM: 4 mmol/L (ref 3.5–5.1)
SODIUM: 131 mmol/L — AB (ref 135–145)
Sodium: 130 mmol/L — ABNORMAL LOW (ref 135–145)

## 2014-06-21 LAB — HEMOGLOBIN AND HEMATOCRIT, BLOOD
HEMATOCRIT: 34.7 % — AB (ref 36.0–46.0)
HEMOGLOBIN: 11.7 g/dL — AB (ref 12.0–15.0)

## 2014-06-21 LAB — MRSA PCR SCREENING: MRSA by PCR: POSITIVE — AB

## 2014-06-21 MED ORDER — MUPIROCIN 2 % EX OINT
1.0000 "application " | TOPICAL_OINTMENT | Freq: Two times a day (BID) | CUTANEOUS | Status: DC
  Administered 2014-06-21 – 2014-06-24 (×6): 1 via NASAL
  Filled 2014-06-21: qty 22

## 2014-06-21 MED ORDER — CHLORHEXIDINE GLUCONATE CLOTH 2 % EX PADS
6.0000 | MEDICATED_PAD | Freq: Every day | CUTANEOUS | Status: DC
  Administered 2014-06-22 – 2014-06-24 (×3): 6 via TOPICAL

## 2014-06-21 MED ORDER — ONDANSETRON HCL 4 MG PO TABS: 4.0000 mg | ORAL_TABLET | ORAL | Status: DC | PRN

## 2014-06-21 MED ORDER — SODIUM CHLORIDE 0.9 % IV SOLN
INTRAVENOUS | Status: DC
  Administered 2014-06-21 – 2014-06-22 (×2): via INTRAVENOUS

## 2014-06-21 MED ORDER — ONDANSETRON HCL 4 MG/2ML IJ SOLN: 4.0000 mg | INTRAMUSCULAR | Status: DC | PRN

## 2014-06-21 MED ORDER — CEFTRIAXONE SODIUM IN DEXTROSE 20 MG/ML IV SOLN
1.0000 g | INTRAVENOUS | Status: DC
  Administered 2014-06-21 – 2014-06-24 (×4): 1 g via INTRAVENOUS
  Filled 2014-06-21 (×4): qty 50

## 2014-06-21 NOTE — Progress Notes (Signed)
Pt to 6N06 via bed, oriented to room.  Bed alarm on.

## 2014-06-21 NOTE — Progress Notes (Signed)
Pt tx 6 North per MD order, pt tol well, pt VSS, report called to receiving RN, all questions answered

## 2014-06-21 NOTE — Progress Notes (Signed)
Patient ID: Alejandra BaizeLinda B Espinoza, female   DOB: 06/14/1940, 74 y.o.   MRN: [id number]  [facility] PROGRESS NOTE  Alejandra Espinoza [id number] DOB: 06/14/1940 DOA: 06/20/2014 PCP: Michiel SitesKOHUT,WALTER DENNIS, MD   Brief narrative:    74 y.o. female with history of DM2, HTN, Hyperlipidemia [facility] with several days duration of progressively worsening abd pain with nausea and non bloody vomiting. She was found to have CBG > 500, and ARF in the ED, and a CT scan of the ABD revealed Antritis and Duodenitis. [facility].   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 UTI based on UA - place on empiric Rocephin and request urine culture - repeat CBC in AM   Hyponatremia - from pre renal etiology - continue IVD and repeat BMP in AM   Transaminitis - LFT's trending down    DVT prophylaxis - change Lovenox SQ to SCD's  Code Status: Full.  Family Communication:  plan of care discussed with the patient Disposition Plan: Home when stable.   IV access:  Peripheral IV  Procedures and diagnostic studies:    Ct Abdomen Pelvis Wo Contrast  06/20/2014   Antritis and proximal duodenitis with probable duodenal ulcer. After  treatment, recommend imaging or endoscopic follow-up to ensure normalization. Moderate hiatal hernia.  Colonic diverticulosis.   Dg Lumbar Spine Complete  06/04/2014    No acute fracture or subluxation. Degenerative changes as described above.     Dg Sacrum/coccyx  06/04/2014  Negative.     Ct Head Wo Contrast   06/04/2014   No acute intracranial abnormality is noted.     Medical Consultants:  Gi over the phone  Other Consultants:  None  IAnti-Infectives:   None  Debbora PrestoMAGICK-Jesika Men, MD  [facility] Pager [phone]  If 7PM-7AM, please contact night-coverage [email or web address] Password [email or web address] 06/21/2014, 8:34 AM   LOS: 1 day   HPI/Subjective: Had nausea and non bloody vomiting.   Objective: Filed Vitals:   06/20/14 1943 06/20/14 2355 06/21/14 0400 06/21/14 0700  BP: 145/65 153/74 152/74   Pulse: 85 87 91   Temp: 98.7 F (37.1 C) 98.3 F (36.8 C) 98.4 F (36.9 C) 98.9 F (37.2 C)  TempSrc: Oral Oral Oral Oral  Resp: 24 19 17    Height: 5\' 2"  (1.575 m)     Weight: 64.7 kg (142 lb 10.2 oz)     SpO2: 95% 96% 95%     Intake/Output Summary (Last 24 hours) at 06/21/14 0834 Last data filed at 06/21/14 0404  Gross per 24 hour  Intake      0 ml  Output    425 ml  Net   -425 ml    Exam:   General:  Pt is alert, follows commands appropriately, not in acute distress  Cardiovascular: Regular rate and rhythm, no rubs, no gallops  Respiratory: Clear to auscultation bilaterally, no wheezing, no crackles, no rhonchi  Abdomen: Soft, tender in epigastric area, non distended, bowel sounds present, no guarding  Extremities: No edema, pulses DP and PT palpable bilaterally  Neuro: Grossly nonfocal  Data Reviewed: Basic Metabolic Panel:  Recent Labs Lab 06/20/14 1000 06/20/14 2156 06/21/14 0126 06/21/14 0548  NA 129* 135 130* 131*  K 4.9 3.7 4.6 4.0  CL 89* 100 100 102  CO2 GLUCOSE 508* 111* 210* 254*  BUN 50* 34* 33* 29*  CREATININE 1.62* 1.21* 1.16* 1.14*  CALCIUM  9.1 8.6 8.5 8.4   Liver Function Tests:  Recent Labs Lab 06/20/14 1000 06/20/14 2156  AST 45* 24  ALT 226* 154*  ALKPHOS 89 73  BILITOT 1.7* 1.0  PROT 8.9* 7.5  ALBUMIN 4.1 3.1*    Recent Labs Lab 06/20/14 1004  LIPASE 39   CBC:  Recent Labs Lab 06/20/14 1000 06/20/14 2156 06/21/14 0126 06/21/14 0548  WBC 14.4*  --  12.1*  --   NEUTROABS 12.9*  --   --   --   HGB 13.9 12.5 12.0 11.7*  HCT 41.0 36.0 34.9* 34.7*  MCV 90.3  --  89.5  --   PLT 191  --  208  --   \CBG:  Recent Labs Lab 06/20/14 1920 06/20/14 2032 06/20/14 2152 06/20/14 2257 06/21/14 0001  GLUCAP 152* 121* 109* 134* 161*   Scheduled Meds: . enoxaparin  injection  30 mg Subcutaneous Q24H  . insulin aspart  0-5 Units Subcutaneous QHS  . insulin aspart  0-9 Units Subcutaneous TID WC  . insulin glargine  5 Units Subcutaneous QHS  . pantoprazole  IV  40 mg Intravenous Q12H   Continuous Infusions: . sodium chloride Stopped (06/20/14 1446)  . sodium chloride    . dextrose 5 % and 0.45% NaCl 100 mL/hr at 06/20/14 1453  . dextrose 5 % and 0.45% NaCl Stopped (06/21/14 0006)  . insulin (NOVOLIN-R) infusion Stopped (06/21/14 0006)

## 2014-06-22 LAB — T4, FREE: FREE T4: 1.49 ng/dL (ref 0.80–1.80)

## 2014-06-22 LAB — COMPREHENSIVE METABOLIC PANEL
ALBUMIN: 3 g/dL — AB (ref 3.5–5.2)
ALT: 79 U/L — ABNORMAL HIGH (ref 0–35)
AST: 14 U/L (ref 0–37)
Alkaline Phosphatase: 61 U/L (ref 39–117)
Anion gap: 9 (ref 5–15)
BUN: 16 mg/dL (ref 6–23)
CHLORIDE: 102 mmol/L (ref 96–112)
CO2: 21 mmol/L (ref 19–32)
Calcium: 8.8 mg/dL (ref 8.4–10.5)
Creatinine, Ser: 0.96 mg/dL (ref 0.50–1.10)
GFR, EST AFRICAN AMERICAN: 67 mL/min — AB (ref >90)
GFR, EST NON AFRICAN AMERICAN: 58 mL/min — AB (ref >90)
Glucose, Bld: 233 mg/dL — ABNORMAL HIGH (ref 70–99)
POTASSIUM: 3.6 mmol/L (ref 3.5–5.1)
Sodium: 132 mmol/L — ABNORMAL LOW (ref 135–145)
TOTAL PROTEIN: 6.6 g/dL (ref 6.0–8.3)
Total Bilirubin: 1.1 mg/dL (ref 0.3–1.2)

## 2014-06-22 LAB — GLUCOSE, CAPILLARY
GLUCOSE-CAPILLARY: 181 mg/dL — AB (ref 70–99)
Glucose-Capillary: 251 mg/dL — ABNORMAL HIGH (ref 70–99)
Glucose-Capillary: 285 mg/dL — ABNORMAL HIGH (ref 70–99)
Glucose-Capillary: 175 mg/dL — ABNORMAL HIGH (ref 70–99)

## 2014-06-22 LAB — CBC
HCT: 33.9 % — ABNORMAL LOW (ref 36.0–46.0)
HEMOGLOBIN: 11.6 g/dL — AB (ref 12.0–15.0)
MCH: 30.9 pg (ref 26.0–34.0)
MCHC: 34.2 g/dL (ref 30.0–36.0)
MCV: 90.2 fL (ref 78.0–100.0)
Platelets: 194 10*3/uL (ref 150–400)
RBC: 3.76 MIL/uL — AB (ref 3.87–5.11)
RDW: 12.8 % (ref 11.5–15.5)
WBC: 7.6 10*3/uL (ref 4.0–10.5)

## 2014-06-22 MED ORDER — SODIUM CHLORIDE 0.9 % IV SOLN: INTRAVENOUS | Status: AC

## 2014-06-22 MED ORDER — INSULIN GLARGINE 100 UNIT/ML ~~LOC~~ SOLN
10.0000 [IU] | Freq: Every day | SUBCUTANEOUS | Status: DC
  Administered 2014-06-22 – 2014-06-23 (×2): 10 [IU] via SUBCUTANEOUS
  Filled 2014-06-22 (×3): qty 0.1

## 2014-06-22 NOTE — Clinical Social Work Note (Signed)
CSW met with patient who was sitting in recliner at bedside. CSW introduced self and explained role. CSW explained SNF placement process and discussed d/c plan with patient. Patient states she lives with son and his wife and would prefer for CSW to discuss SNF placement with son. CSW to follow tomorrow.  [place], [place] Weekend Clinical Social Worker [phone]

## 2014-06-22 NOTE — Progress Notes (Signed)
UR Completed.  [phone]  

## 2014-06-22 NOTE — Evaluation (Signed)
Physical Therapy Evaluation Patient Details Name: Alejandra Espinoza MRN: [id number] DOB: 12/08/1940 Today's Date: 06/22/2014   History of Present Illness  74 yo female with osnet of diabetic ketoacidosis, acute renal failure and leukocytosis, PMHx:  DM, HTN, thyroid disease  Clinical Impression  Pt was seen for evaluation of her functional status with pt declining to use AD, stating she doesn't need it.  May be a result of some confusion with her medical conditions but will definitely not be able to safely go home without one.  Her plan is to go to SNF and restore her independence and safety, then home with family to support with HHPT    Follow Up Recommendations SNF;Supervision/Assistance - 24 hour    Equipment Recommendations  None recommended by PT    Recommendations for Other Services       Precautions / Restrictions Precautions Precautions: Fall Restrictions Weight Bearing Restrictions: No      Mobility  Bed Mobility Overal bed mobility: Needs Assistance Bed Mobility: Supine to Sit     Supine to sit: Min assist;Mod assist     General bed mobility comments: Pt needed assist for LEs to sit up.    Transfers Overall transfer level: Needs assistance Equipment used: 1 person hand held assist (Pt refuse dwalker) Transfers: Sit to/from [employer] Sit to Stand: Min assist Stand pivot transfers: Min assist       General transfer comment: Pt needed reminders for hand placement to stand  Ambulation/Gait Ambulation/Gait assistance: Min assist;Mod assist Ambulation Distance (Feet): 75 Feet Assistive device: 1 person hand held assist Gait Pattern/deviations: Step-to pattern;Decreased dorsiflexion - right;Decreased dorsiflexion - left;Decreased stride length;Ataxic;Wide base of support;Drifts right/left Gait velocity: reduced Gait velocity interpretation: Below normal speed for age/gender General Gait Details: Pt needed steadying with no walker and declined  to use it.  She is stiff in ankles and walking with rigid and incoordinated sequence.  Pt is too stiff and unsafe for walking without AD unless help is with her the entire time.  Stairs            Wheelchair Mobility    Modified Rankin (Stroke Patients Only)       Balance Overall balance assessment: Needs assistance Sitting-balance support: Feet supported Sitting balance-Leahy Scale: Fair   Postural control: Posterior lean Standing balance support: Single extremity supported Standing balance-Leahy Scale: Poor                               Pertinent Vitals/Pain Pain Assessment: No/denies pain    Home Living Family/patient expects to be discharged to:: Private residence Living Arrangements: Children Available Help at Discharge: Family;Available 24 hours/day Type of Home: House Home Access: Stairs to enter Entrance Stairs-Rails: None Entrance Stairs-Number of Steps: 1 Home Layout: One level Home Equipment: Bedside commode;Walker - 4 wheels;Cane - single point;Grab bars - tub/shower      Prior Function Level of Independence: Needs assistance   Gait / Transfers Assistance Needed: Pt states she did not need AD  ADL's / Homemaking Assistance Needed: son and his wife do all  Comments: Pt states she only has to do self care and son does all housework and cooking     Hand Dominance   Dominant Hand: Right    Extremity/Trunk Assessment   Upper Extremity Assessment: Overall WFL for tasks assessed           Lower Extremity Assessment: Generalized weakness  Cervical / Trunk Assessment: Normal  Communication   Communication: No difficulties  Cognition Arousal/Alertness: Awake/alert Behavior During Therapy: WFL for tasks assessed/performed Overall Cognitive Status: Within Functional Limits for tasks assessed       Memory: Decreased short-term memory              General Comments General comments (skin integrity, edema, etc.): Pt is  reporting more independence than is likely at home, as she is quite stiff and unsafe to walk.  SNF will be her best option as family will not be able to manage this easily.    Exercises General Exercises - Lower Extremity Ankle Circles/Pumps: AAROM;Both;5 reps Quad Sets: Strengthening;Both;5 reps Long Arc Quad: Strengthening;Both;5 reps      Assessment/Plan    PT Assessment Patient needs continued PT services  PT Diagnosis Generalized weakness   PT Problem List Decreased strength;Decreased range of motion;Decreased activity tolerance;Decreased balance;Decreased mobility;Decreased coordination;Decreased safety awareness  PT Treatment Interventions DME instruction;Gait training;Stair training;Functional mobility training;Therapeutic activities;Therapeutic exercise;Balance training;Neuromuscular re-education;Patient/family education   PT Goals (Current goals can be found in the Care Plan section) Acute Rehab PT Goals Patient Stated Goal: to go home PT Goal Formulation: With patient Time For Goal Achievement: 07/04/14 Potential to Achieve Goals: Good    Frequency Min 3X/week   Barriers to discharge Inaccessible home environment      Co-evaluation               End of Session Equipment Utilized During Treatment: Gait belt Activity Tolerance: Patient tolerated treatment well Patient left: in chair;with call bell/phone within reach Nurse Communication: Mobility status         Time: [id number] PT Time Calculation (min) (ACUTE ONLY): 27 min   Charges:   PT Evaluation $Initial PT Evaluation Tier I: 1 Procedure PT Treatments $Gait Training: 8-22 mins   PT G CodesIvar Drape:        Sheyla Zaffino E 06/22/2014, 9:39 AM   Samul Dadauth Alezandra Egli, PT MS Acute Rehab Dept. Number: [phone]

## 2014-06-22 NOTE — Progress Notes (Addendum)
Patient ID: Alejandra Espinoza, female   DOB: 1940/09/27, 74 y.o.   MRN: [id number]  [facility] PROGRESS NOTE  ARIYANA FAW YQM:[id number] DOB: [birth year] DOA: 06/20/2014 PCP: Michiel Sites, MD   Brief narrative:    74 y.o. female with history of DM2, HTN, Hyperlipidemia presented to the [facility] ED with several days duration of progressively worsening abd pain with nausea and non bloody vomiting. She was found to have CBG > 500, and ARF in the ED, and a CT scan of the ABD revealed Antritis and Duodenitis. She was transferred to [facility].   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 UTI based on UA - placed on empiric Rocephin until urine culture results are back  - WBC is now WNL  - repeat CBC in AM   Hyponatremia - from pre renal etiology - continue IVF as sodium is trending up  - repeat BMP in AM   Transaminitis - LFT's trending down   DVT prophylaxis - changed Lovenox SQ to SCD's 3/19  Code Status: Full.  Family Communication:  plan of care discussed with the  patient Disposition Plan: Home vs SNFin 24 - 48 hours   IV access:  Peripheral IV  Procedures and diagnostic studies:    Ct Abdomen Pelvis Wo Contrast  06/20/2014   Antritis and proximal duodenitis with probable duodenal ulcer. After treatment, recommend imaging or endoscopic follow-up to ensure normalization. Moderate hiatal hernia.  Colonic diverticulosis.   Dg Lumbar Spine Complete  06/04/2014    No acute fracture or subluxation. Degenerative changes as described above.     Dg Sacrum/coccyx  06/04/2014  Negative.     Ct Head Wo Contrast   06/04/2014   No acute intracranial abnormality is noted.     Medical Consultants:  GI over the phone  Other Consultants:  None  IAnti-Infectives:   None  Debbora Presto, MD  [facility] Pager [phone]  If 7PM-7AM, please contact night-coverage [email or web address] Password [facility] 06/22/2014, 11:49 AM   LOS: 2 days   HPI/Subjective: Had nausea and non bloody vomiting.   Objective: Filed Vitals:   06/21/14 1845 06/21/14 2057 06/22/14 0202 06/22/14 0602  BP: 149/65 137/66 144/61 156/72  Pulse: 84 87 86 80  Temp: 98.5 F (36.9 C) 98.3 F (36.8 C) 98.5 F (36.9 C) 98.3 F (36.8 C)  TempSrc: Oral Oral Oral Oral  Resp: Height:      Weight:  SpO2: 94% 93% 95% 93%    Intake/Output Summary (Last 24 hours) at 06/22/14 1149 Last data filed at 06/22/14 0920  Gross per 24 hour  Intake    360 ml  Output      0 ml  Net    360 ml    Exam:   General:  Pt is alert, follows commands appropriately, not in acute distress  Cardiovascular: Regular rate and rhythm, no rubs, no gallops  Respiratory: Clear to auscultation bilaterally, no wheezing, no crackles, no rhonchi  Abdomen: Soft, less tender in epigastric area, non distended, bowel sounds present, no guarding  Extremities: No edema, pulses DP and PT palpable bilaterally  Neuro: Grossly nonfocal  Data Reviewed: Basic Metabolic Panel:  Recent Labs Lab 06/20/14 1000  06/20/14 2156 06/21/14 0126 06/21/14 0548 06/22/14 0357  NA 129* 135 130* 131* 132*  K 4.9 3.7 4.6 4.0 3.6  CL 89* 100 100 102 102  CO2 20 23 20 20 21   GLUCOSE 508* 111* 210* 254* 233*  BUN 50* 34* 33* 29* 16  CREATININE 1.62* 1.21* 1.16* 1.14* 0.96  CALCIUM 9.1 8.6 8.5 8.4 8.8   Liver Function Tests:  Recent Labs Lab 06/20/14 1000 06/20/14 2156 06/22/14 0357  AST 45* 24 14  ALT 226* 154* 79*  ALKPHOS 89 73 61  BILITOT 1.7* 1.0 1.1  PROT 8.9* 7.5 6.6  ALBUMIN 4.1 3.1* 3.0*    Recent Labs Lab 06/20/14 1004  LIPASE 39   CBC:  Recent Labs Lab 06/20/14 1000 06/20/14 2156 06/21/14 0126 06/21/14 0548 06/22/14 0357  WBC 14.4*  --  12.1*  --  7.6  NEUTROABS 12.9*  --   --   --   --   HGB 13.9 12.5 12.0 11.7* 11.6*  HCT 41.0 36.0 34.9* 34.7* 33.9*  MCV 90.3  --  89.5  --  90.2  PLT 191  --  208  --  194  \CBG:  Recent Labs Lab 06/21/14 0001 06/21/14 0751 06/21/14 1623 06/21/14 2241 06/22/14 0757  GLUCAP 161* 269* 208* 284* 251*   Scheduled Meds: . cefTRIAXone (ROCEPHIN)  IV  1 g Intravenous Q24H  . Chlorhexidine Gluconate Cloth  6 each Topical Q0600  . insulin aspart  0-5 Units Subcutaneous QHS  . insulin aspart  0-9 Units Subcutaneous TID WC  . insulin glargine  5 Units Subcutaneous QHS  . mupirocin ointment  1 application Nasal BID  . pantoprazole (PROTONIX) IV  40 mg Intravenous Q12H  . sodium chloride  500 mL Intravenous Once  . sodium chloride  3 mL Intravenous Q12H    Continuous Infusions: . sodium chloride 75 mL/hr at 06/22/14 0915

## 2014-06-23 LAB — CBC
HCT: 32.8 % — ABNORMAL LOW (ref 36.0–46.0)
Hemoglobin: 11.2 g/dL — ABNORMAL LOW (ref 12.0–15.0)
MCH: 30.4 pg (ref 26.0–34.0)
MCHC: 34.1 g/dL (ref 30.0–36.0)
MCV: 89.1 fL (ref 78.0–100.0)
Platelets: 196 10*3/uL (ref 150–400)
RBC: 3.68 MIL/uL — AB (ref 3.87–5.11)
RDW: 12.7 % (ref 11.5–15.5)
WBC: 6.8 10*3/uL (ref 4.0–10.5)

## 2014-06-23 LAB — GLUCOSE, CAPILLARY
GLUCOSE-CAPILLARY: 215 mg/dL — AB (ref 70–99)
GLUCOSE-CAPILLARY: 292 mg/dL — AB (ref 70–99)
GLUCOSE-CAPILLARY: 217 mg/dL — AB (ref 70–99)
GLUCOSE-CAPILLARY: 368 mg/dL — AB (ref 70–99)
Glucose-Capillary: 264 mg/dL — ABNORMAL HIGH (ref 70–99)

## 2014-06-23 LAB — BASIC METABOLIC PANEL
Anion gap: 5 (ref 5–15)
BUN: 12 mg/dL (ref 6–23)
CO2: 26 mmol/L (ref 19–32)
CREATININE: 0.98 mg/dL (ref 0.50–1.10)
Calcium: 8.7 mg/dL (ref 8.4–10.5)
Chloride: 103 mmol/L (ref 96–112)
GFR, EST AFRICAN AMERICAN: 66 mL/min — AB (ref >90)
GFR, EST NON AFRICAN AMERICAN: 57 mL/min — AB (ref >90)
Glucose, Bld: 288 mg/dL — ABNORMAL HIGH (ref 70–99)
Potassium: 3.5 mmol/L (ref 3.5–5.1)
Sodium: 134 mmol/L — ABNORMAL LOW (ref 135–145)

## 2014-06-23 NOTE — Progress Notes (Signed)
Inpatient Diabetes Program Recommendations  AACE/ADA: New Consensus Statement on Inpatient Glycemic Control (2013)  Target Ranges:  Prepandial:   less than 140 mg/dL      Peak postprandial:   less than 180 mg/dL (1-2 hours)      Critically ill patients:  140 - 180 mg/dL   Inpatient Diabetes Program Recommendations Correction (SSI): Please increase correction to moderate tidwc Insulin - Meal Coverage: xxxx HgbA1C: High at 12.5% in early March. Diabetes Coordinator has talked with this patient  several times during previous admission  Thank you Lenor CoffinAnn Fatuma Dowers, RN, MSN, CDE  Diabetes Inpatient Program Office: [phone] Pager: [phone] 8:00 am to 5:00 pm

## 2014-06-23 NOTE — Clinical Social Work Note (Addendum)
CSW met with patient and her family at bedside.  CSW discussed SNF placement and the process, and patient states she does not want to go to a SNF, and would like home health PT.  CSW notified case manager who is aware that patient would like home health.  Patient lives with her son his family, and her spouse, patient has good support network at home.  CSW will continue to follow in case patient changes her mind.  Alejandra Espinoza. [place], MSW, [place] 06/23/2014 1:09 PM

## 2014-06-23 NOTE — Care Management Note (Signed)
  Page 2 of 2   06/24/2014     11:10:21 AM CARE MANAGEMENT NOTE 06/24/2014  Patient:  Jewel BaizeCARTER,Aliza B   Account Number:  [id number]  Date Initiated:  06/23/2014  Documentation initiated by:  Ronny FlurryWILE,Corvette Orser  Subjective/Objective Assessment:     Action/Plan:   Anticipated DC Date:  06/24/2014   Anticipated DC Plan:  HOME W HOME HEALTH SERVICES         Choice offered to / List presented to:  C-4 Adult Children        HH arranged  HH-1 RN  HH-2 PT  HH-4 NURSE'S AIDE      [facility]   Status of service:  Completed, signed off Medicare Important Message given?  YES (If response is "NO", the following Medicare IM given date fields will be blank) Date Medicare IM given:  06/23/2014 Medicare IM given by:  Ronny FlurryWILE,Deago Burruss Date Additional Medicare IM given:   Additional Medicare IM given by:    Discharge Disposition:  HOME W HOME HEALTH SERVICES  Per UR Regulation:  Reviewed for med. necessity/level of care/duration of stay  If discussed at Long Length of Stay Meetings, dates discussed:    Comments:  06-24-14 [facility] declined referral , they do not work with Dr Juleen ChinaKohut ( PCP ) patient's , no further reason given . Patient's son Reuel BoomDaniel aware . [facility] . [name] aware. Ronny FlurryHeather Onix Jumper RN BSN    06-23-14 Patient lives with husband and son Reuel BoomDaniel [phone] , patient 's cell is [phone].  Address is [address] , [place] [id number].  Patient and family aware PT recommending short term rehab at SNF , however, patient wants to go home at discharge , family in agreement.   Patient already has walker and bedside commode at home.  Ronny FlurryHeather Gregory Dowe RN BSN [phone]

## 2014-06-23 NOTE — Evaluation (Signed)
Occupational Therapy Evaluation Patient Details Name: Alejandra Espinoza MRN: [id number] DOB: 08-28-1940 Today's Date: 06/23/2014    History of Present Illness 74 yo female with osnet of diabetic ketoacidosis, acute renal failure and leukocytosis, PMHx:  DM, HTN, thyroid disease   Clinical Impression   Pt up to the bathroom with min assist and pushing IV pole. Pt state she is not sure what her d/c plan is. Will benefit from SNF unless family able to provide 24/7 assist. Will follow on acute to progress ADL independence.     Follow Up Recommendations  SNF;Supervision/Assistance - 24 hour    Equipment Recommendations  None recommended by OT    Recommendations for Other Services       Precautions / Restrictions Precautions Precautions: Fall      Mobility Bed Mobility               General bed mobility comments: OOB in chair.  Transfers Overall transfer level: Needs assistance Equipment used: None Transfers: Sit to/from Stand Sit to Stand: Min assist         General transfer comment: did well with hand placement.    Balance                                            ADL   Eating/Feeding: Independent;Sitting   Grooming: Wash/dry hands;Minimal assistance;Standing   Upper Body Bathing: Set up;Sitting   Lower Body Bathing: Minimal assistance;Sit to/from stand   Upper Body Dressing : Set up;Sitting   Lower Body Dressing: Minimal assistance;Sit to/from stand   Toilet Transfer: Minimal assistance;Ambulation Toilet Transfer Details (indicate cue type and reason): pushing IV pole Toileting- Clothing Manipulation and Hygiene: Minimal assistance;Sit to/from stand         General ADL Comments: Pt pushed IV pole to transfer into the bathroom and requires min assist for balance support. She states she feels weaker than her baseline and agrees she may need to use a walker.      Vision     Perception     Praxis      Pertinent Vitals/Pain  Pain Assessment: Faces Faces Pain Scale: Hurts a little bit Pain Location: buttocks Pain Descriptors / Indicators: Sore Pain Intervention(s): Repositioned     Hand Dominance Right   Extremity/Trunk Assessment Upper Extremity Assessment Upper Extremity Assessment: Overall WFL for tasks assessed           Communication Communication Communication: No difficulties   Cognition Arousal/Alertness: Awake/alert Behavior During Therapy: WFL for tasks assessed/performed Overall Cognitive Status: No family/caregiver present to determine baseline cognitive functioning       Memory: Decreased short-term memory             General Comments       Exercises       Shoulder Instructions      Home Living Family/patient expects to be discharged to:: Private residence Living Arrangements: Children Available Help at Discharge: Family;Available 24 hours/day Type of Home: House Home Access: Stairs to enter [employer] of Steps: 1 Entrance Stairs-Rails: None Home Layout: One level     Bathroom Shower/Tub: Chief Strategy Officer: Standard     Home Equipment: Bedside commode;Walker - 4 wheels;Cane - single point;Grab bars - tub/shower;Grab bars - toilet          Prior Functioning/Environment Level of Independence: Needs assistance  Gait / Transfers Assistance  Needed: Pt states she did not need AD ADL's / Homemaking Assistance Needed: son and his wife do all laundry, cooking. Pt states she could manage her own self care of bathing, dressing, toileting, grooming.   Comments: Pt states she only has to do self care and son does all housework and cooking    OT Diagnosis: Generalized weakness   OT Problem List: Decreased strength;Decreased knowledge of use of DME or AE   OT Treatment/Interventions:      OT Goals(Current goals can be found in the care plan section) Acute Rehab OT Goals Patient Stated Goal: none stated. pt agreeable to work with OT. OT  Goal Formulation: With patient Time For Goal Achievement: 07/07/14 Potential to Achieve Goals: Good  OT Frequency:     Barriers to D/C:            Co-evaluation              End of Session Equipment Utilized During Treatment: Gait belt  Activity Tolerance: Patient tolerated treatment well Patient left: in chair;with call bell/phone within reach   Time: 1000-1020 OT Time Calculation (min): 20 min Charges:  OT General Charges $OT Visit: 1 Procedure OT Evaluation $Initial OT Evaluation Tier I: 1 Procedure G-Codes:    Lennox LaityStone, Xaivier Malay Stafford  [phone] 06/23/2014, 10:27 AM

## 2014-06-23 NOTE — Progress Notes (Signed)
Patient ID: Alejandra Espinoza, female   DOB: [birth year], 74 y.o.   MRN: [id number]  [facility] PROGRESS NOTE  JAMAYA SLEETH WUJ:[id number] DOB: Mar 15, 1941 DOA: 06/20/2014 PCP: Michiel Sites, MD   Brief narrative:    74 y.o. female with history of DM2, HTN, Hyperlipidemia presented to the [facility] ED with several days duration of progressively worsening abd pain with nausea and non bloody vomiting. She was found to have CBG > 500, and ARF in the ED, and a CT scan of the ABD revealed Antritis and Duodenitis. She was transferred to [facility] Unit.   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 UTI based on UA - placed on empiric Rocephin until urine culture results are back  - WBC is now WNL  - repeat CBC in AM   Hyponatremia - from pre renal etiology - sodium is trending up  - repeat BMP in AM   Transaminitis - LFT's trending down   DVT prophylaxis - changed Lovenox SQ to SCD's 3/19  Code Status: Full.  Family Communication:  plan of care discussed with the patient Disposition Plan: SNFin 24 - 48 hours   IV  access:  Peripheral IV  Procedures and diagnostic studies:    Ct Abdomen Pelvis Wo Contrast  06/20/2014   Antritis and proximal duodenitis with probable duodenal ulcer. After treatment, recommend imaging or endoscopic follow-up to ensure normalization. Moderate hiatal hernia.  Colonic diverticulosis.   Dg Lumbar Spine Complete  06/04/2014    No acute fracture or subluxation. Degenerative changes as described above.     Dg Sacrum/coccyx  06/04/2014  Negative.     Ct Head Wo Contrast   06/04/2014   No acute intracranial abnormality is noted.     Medical Consultants:  GI over the phone  Other Consultants:  None  IAnti-Infectives:   None  Debbora Presto, MD  [facility] Pager [phone]  If 7PM-7AM, please contact night-coverage [email or web address] Password [email or web address] 06/23/2014, 1:06 PM   LOS: 3 days   HPI/Subjective: Had nausea and non bloody vomiting.   Objective: Filed Vitals:   06/22/14 0602 06/22/14 1300 06/22/14 2116 06/23/14 0541  BP: 156/72 149/71 152/76 146/76  Pulse: 80 82 76 78  Temp: 98.3 F (36.8 C) 98.2 F (36.8 C) 98 F (36.7 C) 98.9 F (37.2 C)  TempSrc: Oral Oral Oral Oral  Resp: Height:      Weight:      SpO2: 93% 95% 97% 95%    Intake/Output Summary (Last 24  hours) at 06/23/14 1306 Last data filed at 06/23/14 0935  Gross per 24 hour  Intake    360 ml  Output      0 ml  Net    360 ml    Exam:   General:  Pt is alert, follows commands appropriately, not in acute distress  Cardiovascular: Regular rate and rhythm, no rubs, no gallops  Respiratory: Clear to auscultation bilaterally, no wheezing, no crackles, no rhonchi  Abdomen: Soft, less tender in epigastric area, non distended, bowel sounds present, no guarding  Extremities: No edema, pulses DP and PT palpable bilaterally  Neuro: Grossly nonfocal  Data Reviewed: Basic Metabolic Panel:  Recent Labs Lab 06/20/14 2156 06/21/14 0126 06/21/14 0548 06/22/14 0357 06/23/14 0440  NA 135  130* 131* 132* 134*  K 3.7 4.6 4.0 3.6 3.5  CL 100 100 102 102 103  CO2 23 20 20 21 26   GLUCOSE 111* 210* 254* 233* 288*  BUN 34* 33* 29* 16 12  CREATININE 1.21* 1.16* 1.14* 0.96 0.98  CALCIUM 8.6 8.5 8.4 8.8 8.7   Liver Function Tests:  Recent Labs Lab 06/20/14 1000 06/20/14 2156 06/22/14 0357  AST 45* 24 14  ALT 226* 154* 79*  ALKPHOS 89 73 61  BILITOT 1.7* 1.0 1.1  PROT 8.9* 7.5 6.6  ALBUMIN 4.1 3.1* 3.0*    Recent Labs Lab 06/20/14 1004  LIPASE 39   CBC:  Recent Labs Lab 06/20/14 1000 06/20/14 2156 06/21/14 0126 06/21/14 0548 06/22/14 0357 06/23/14 0440  WBC 14.4*  --  12.1*  --  7.6 6.8  NEUTROABS 12.9*  --   --   --   --   --   HGB 13.9 12.5 12.0 11.7* 11.6* 11.2*  HCT 41.0 36.0 34.9* 34.7* 33.9* 32.8*  MCV 90.3  --  89.5  --  90.2 89.1  PLT 191  --  208  --  194 196  \CBG:  Recent Labs Lab 06/22/14 1153 06/22/14 1740 06/22/14 2122 06/23/14 0729 06/23/14 1119  GLUCAP 285* 175* 181* 215* 292*   Scheduled Meds: . cefTRIAXone (ROCEPHIN)  IV  1 g Intravenous Q24H  . Chlorhexidine Gluconate Cloth  6 each Topical Q0600  . insulin aspart  0-5 Units Subcutaneous QHS  . insulin aspart  0-9 Units Subcutaneous TID WC  . insulin glargine  10 Units Subcutaneous QHS  . mupirocin ointment  1 application Nasal BID  . pantoprazole (PROTONIX) IV  40 mg Intravenous Q12H  . sodium chloride  3 mL Intravenous Q12H    Continuous Infusions:

## 2014-06-24 ENCOUNTER — Encounter: Payer: Self-pay | Admitting: Internal Medicine

## 2014-06-24 LAB — BASIC METABOLIC PANEL
ANION GAP: 9 (ref 5–15)
BUN: 11 mg/dL (ref 6–23)
CO2: 25 mmol/L (ref 19–32)
Calcium: 9.1 mg/dL (ref 8.4–10.5)
Chloride: 102 mmol/L (ref 96–112)
Creatinine, Ser: 0.98 mg/dL (ref 0.50–1.10)
GFR, EST AFRICAN AMERICAN: 66 mL/min — AB (ref >90)
GFR, EST NON AFRICAN AMERICAN: 57 mL/min — AB (ref >90)
Glucose, Bld: 182 mg/dL — ABNORMAL HIGH (ref 70–99)
POTASSIUM: 3.6 mmol/L (ref 3.5–5.1)
Sodium: 136 mmol/L (ref 135–145)

## 2014-06-24 LAB — CBC
HEMATOCRIT: 35.1 % — AB (ref 36.0–46.0)
HEMOGLOBIN: 12 g/dL (ref 12.0–15.0)
MCH: 30.5 pg (ref 26.0–34.0)
MCHC: 34.2 g/dL (ref 30.0–36.0)
MCV: 89.3 fL (ref 78.0–100.0)
PLATELETS: 208 10*3/uL (ref 150–400)
RBC: 3.93 MIL/uL (ref 3.87–5.11)
RDW: 12.6 % (ref 11.5–15.5)
WBC: 7.6 10*3/uL (ref 4.0–10.5)

## 2014-06-24 LAB — GLUCOSE, CAPILLARY
GLUCOSE-CAPILLARY: 267 mg/dL — AB (ref 70–99)
Glucose-Capillary: 201 mg/dL — ABNORMAL HIGH (ref 70–99)

## 2014-06-24 MED ORDER — PANTOPRAZOLE SODIUM 40 MG PO TBEC: 40.0000 mg | DELAYED_RELEASE_TABLET | Freq: Two times a day (BID) | ORAL | Status: AC

## 2014-06-24 MED ORDER — HYDROCODONE-ACETAMINOPHEN 5-325 MG PO TABS: 1.0000 | ORAL_TABLET | Freq: Four times a day (QID) | ORAL | Status: DC | PRN

## 2014-06-24 NOTE — Clinical Social Work Note (Signed)
CSW received referral for SNF.  Case discussed with case manager, and plan is to discharge home with home health.  CSW to sign off please re-consult if social work needs arise.  Laurajean Hosek R. Keniel Ralston, MSW, LCSWA [phone]  

## 2014-06-24 NOTE — Progress Notes (Signed)
Dc instructions given to pt and family no questions at this time

## 2014-06-24 NOTE — Discharge Summary (Signed)
Physician Discharge Summary  Alejandra Espinoza:[id number] DOB: 1940/07/30 DOA: 06/20/2014  PCP: Michiel Sites, MD  Admit date: 06/20/2014 Discharge date: 06/24/2014  Recommendations for Outpatient Follow-up:  1. Pt will need to follow up with PCP in 2-3 weeks post discharge 2. Please obtain BMP to evaluate electrolytes and kidney function 3. Please also check CBC to evaluate Hg and Hct levels  Discharge Diagnoses:  Principal Problem:   AKI (acute kidney injury) Active Problems:   Hyperlipidemia   Hypertension   Thyroid disease   DKA (diabetic ketoacidosis)   Abdominal pain   Duodenitis    Discharge Condition: Stable  Diet recommendation: Heart healthy diet discussed in details    Brief narrative:    74 y.o. female with history of DM2, HTN, Hyperlipidemia presented to the [facility] ED with several days duration of progressively worsening abd pain with nausea and non bloody vomiting. She was found to have CBG > 500, and ARF in the ED, and a CT scan of the ABD revealed Antritis and Duodenitis. She was transferred to [facility] Unit.   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 UTI based on UA - placed on empiric Rocephin until urine culture results are back  - WBC is now WNL   Hyponatremia - from pre renal etiology - sodium is trending up, 134 this AM  Transaminitis - LFT's trending down   Code Status: Full.  Family Communication: plan of care discussed with the patient Disposition Plan: Home today per pt request   IV access:  Peripheral IV  Procedures and diagnostic studies:   Ct Abdomen Pelvis Wo Contrast 06/20/2014 Antritis and proximal duodenitis with probable duodenal ulcer. After treatment, recommend imaging or endoscopic follow-up to ensure normalization. Moderate hiatal hernia. Colonic diverticulosis.   Dg Lumbar Spine Complete 06/04/2014 No acute fracture or subluxation. Degenerative changes as described above.   Dg Sacrum/coccyx 06/04/2014 Negative.   Ct Head Wo Contrast 06/04/2014 No acute intracranial abnormality is noted.   Medical Consultants:  GI over the phone  Other Consultants:  None  IAnti-Infectives:   None      Discharge Exam: Filed Vitals:   06/24/14 0555  BP: 150/74  Pulse: 75  Temp: 98.1 F (36.7 C)  Resp: 18   Filed Vitals:   06/23/14 0541 06/23/14 1500 06/23/14 2153 06/24/14 0555  BP: 146/76 138/74 163/71 150/74  Pulse: 78 82 82 75  Temp: 98.9 F (37.2 C) 98.5 F (36.9 C) 97.6 F (36.4 C) 98.1 F (36.7 C)  TempSrc: Oral Oral Oral Oral  Resp: 18  Height:      Weight:      SpO2: 95% 96% 96% 96%    General: Pt is alert, follows commands appropriately, not in acute distress Cardiovascular: Regular rate and rhythm, no rubs, no gallops Respiratory: Clear to auscultation bilaterally, no wheezing, no crackles, no rhonchi Abdominal: Soft, non tender, non distended, bowel sounds +, no guarding Extremities: no edema, no cyanosis, pulses palpable bilaterally DP and  PT Neuro: Grossly nonfocal  Discharge Instructions  Discharge Instructions    Diet - low sodium heart healthy    Complete by:  As directed      Increase activity slowly    Complete by:  As directed             Medication List    STOP taking these medications        linagliptin 5 MG Tabs tablet  Commonly known as:  TRADJENTA      TAKE these medications        ACCU-CHEK AVIVA PLUS test strip  Generic drug:  glucose blood  1 each by Other route 2 (two) times daily.     acetaminophen 500 MG tablet  Commonly known as:  TYLENOL  Take 500 mg by mouth every 6 (six) hours as needed for mild pain or moderate pain.     hydrochlorothiazide 25 MG tablet  Commonly known as:  HYDRODIURIL  Take 25 mg by mouth daily.     HYDROcodone-acetaminophen 5-325 MG per tablet  Commonly known as:  NORCO  Take 1 tablet by mouth every 6 (six) hours as needed for moderate pain.     JANUVIA 50 MG tablet  Generic drug:  sitaGLIPtin  Take 50 mg by mouth daily.     lisinopril 20 MG tablet  Commonly known as:  PRINIVIL,ZESTRIL  Take 2 tablets (40 mg total) by mouth daily.     LYRICA 50 MG capsule  Generic drug:  pregabalin  Take 50 mg by mouth 2 (two) times daily.     metFORMIN 1000 MG tablet  Commonly known as:  GLUCOPHAGE  Take 0.5 tablets (500 mg total) by mouth 2 (two) times daily with a meal.     pantoprazole 40 MG tablet  Commonly known as:  PROTONIX  Take 1 tablet (40 mg total) by mouth 2 (two) times daily.     propylthiouracil 50 MG tablet  Commonly known as:  PTU  Take 50 mg by mouth 2 (two) times daily.     silver sulfADIAZINE 1 % cream  Commonly known as:  SILVADENE  Apply 1 application topically daily.           Follow-up Information    Follow up with Yancey Flemings, MD On 08/13/2014.   Specialty:  Gastroenterology   Why:  Appointment scheduled on May 11th, 2016 at 8:45 am, please arrive 10 minutes earlier    Contact information:   [address] [place]  [address] [phone]        The results of significant diagnostics from this hospitalization (including imaging, microbiology, ancillary and laboratory) are listed below for reference.     Microbiology: Recent Results (from the past 240 hour(s))  MRSA PCR Screening     Status: Abnormal   Collection Time: 06/21/14  5:31 AM  Result Value Ref Range Status   MRSA by PCR POSITIVE (A) NEGATIVE Final    Comment:        The GeneXpert MRSA Assay (FDA approved for NASAL specimens only), is one component  of a comprehensive MRSA colonization surveillance program. It is not intended to diagnose MRSA infection nor to guide or monitor treatment for MRSA infections. RESULT CALLED TO, READ BACK BY AND VERIFIED WITH: T.DAVIS,RN 06/21/14 @1020  BY V.WILKINS      Labs: Basic Metabolic Panel:  Recent Labs Lab 06/20/14 2156 06/21/14 0126 06/21/14 0548 06/22/14 0357 06/23/14 0440  NA 135 130* 131* 132* 136  134*  K 3.7 4.6 4.0 3.6 3.6  3.5  CL 100 100 102 102 102  103  CO2 23 20 20 21 25  26   GLUCOSE 111* 210* 254* 233* 182*  288*  BUN 34* 33* 29* 16 11  12   CREATININE 1.21* 1.16* 1.14* 0.96 0.98  0.98  CALCIUM 8.6 8.5 8.4 8.8 9.1  8.7   Liver Function Tests:  Recent Labs Lab 06/20/14 1000 06/20/14 2156 06/22/14 0357  AST 45* 24 14  ALT 226* 154* 79*  ALKPHOS 89 73 61  BILITOT 1.7* 1.0 1.1  PROT 8.9* 7.5 6.6  ALBUMIN 4.1 3.1* 3.0*    Recent Labs Lab 06/20/14 1004  LIPASE 39   CBC:  Recent Labs Lab 06/20/14 1000 06/20/14 2156 06/21/14 0126 06/21/14 0548 06/22/14 0357 06/23/14 0440  WBC 14.4*  --  12.1*  --  7.6 7.6  6.8  NEUTROABS 12.9*  --   --   --   --   --   HGB 13.9 12.5 12.0 11.7* 11.6* 12.0  11.2*  HCT 41.0 36.0 34.9* 34.7* 33.9* 35.1*  32.8*  MCV 90.3  --  89.5  --  90.2 89.3  89.1  PLT 191  --  208  --  194 208  196    CBG:  Recent Labs Lab 06/23/14 0729 06/23/14 1119 06/23/14 1725 06/23/14 2152 06/24/14 0757  GLUCAP 215*  292* 217* 368* 201*   SIGNED: Time coordinating discharge: Over 30 minutes  Debbora PrestoMAGICK-Liliani Bobo, MD  [facility] 06/24/2014, 10:38 AM Pager [phone]  If 7PM-7AM, please contact night-coverage [email or web address] Password [email or web address]

## 2014-06-24 NOTE — Progress Notes (Signed)
Physical Therapy Treatment Patient Details Name: Jewel BaizeLinda B Lant MRN: [id number] DOB: [birth year] Today's Date: 06/24/2014    History of Present Illness 74 yo female with osnet of diabetic ketoacidosis, acute renal failure and leukocytosis, PMHx:  DM, HTN, thyroid disease    PT Comments    Patient is progressing very well this session. She was very agreeable to use Rw with ambulation. Patient and family have now [facility]. Patient has assistance from son, daughter ([place] in from [place] , and her husband. Recommend HHPT at discharge. Patient has RW at home.   Follow Up Recommendations  Home health PT;Supervision/Assistance - 24 hour     Equipment Recommendations  None recommended by PT    Recommendations for Other Services       Precautions / Restrictions Precautions Precautions: Fall    Mobility  Bed Mobility         Supine to sit: Supervision     General bed mobility comments: Supervision for safety  Transfers Overall transfer level: Needs assistance Equipment used: Rolling walker (2 wheeled)   Sit to Stand: Min guard         General transfer comment: Minguard for safety. Patient with correct hand placement and use of RW  Ambulation/Gait Ambulation/Gait assistance: Min guard Ambulation Distance (Feet): 300 Feet Assistive device: Rolling walker (2 wheeled) Gait Pattern/deviations: Step-through pattern;Decreased stride length     General Gait Details: Patient very agreeable to use RW this session. Cues for upright posture and to look forward. Did well ambulating with RW and increased endurance   Stairs            Wheelchair Mobility    Modified Rankin (Stroke Patients Only)       Balance                                    Cognition Arousal/Alertness: Awake/alert Behavior During Therapy: WFL for tasks assessed/performed Overall Cognitive Status: No family/caregiver present to determine  baseline cognitive functioning                      Exercises      General Comments        Pertinent Vitals/Pain Pain Assessment: No/denies pain    Home Living                      Prior Function            PT Goals (current goals can now be found in the care plan section) Progress towards PT goals: Progressing toward goals    Frequency  Min 3X/week    PT Plan Discharge plan needs to be updated    Co-evaluation             End of Session   Activity Tolerance: Patient tolerated treatment well Patient left: in chair;with call bell/phone within reach     Time: 1000-1024 PT Time Calculation (min) (ACUTE ONLY): 24 min  Charges:  $Gait Training: 8-22 mins $Therapeutic Activity: 8-22 mins                    G Codes:      Fredrich BirksRobinette, Julia Elizabeth 06/24/2014, 10:34 AM  06/24/2014 Fredrich Birksobinette, Julia Elizabeth PTA [phone] pager [phone] office

## 2014-06-24 NOTE — Discharge Instructions (Signed)

## 2014-08-13 ENCOUNTER — Ambulatory Visit: Payer: Medicare Other | Admitting: Internal Medicine

## 2014-08-14 ENCOUNTER — Other Ambulatory Visit: Payer: Self-pay | Admitting: Internal Medicine

## 2015-09-30 ENCOUNTER — Ambulatory Visit: Payer: BC Managed Care – PPO | Admitting: Diagnostic Neuroimaging

## 2015-10-05 ENCOUNTER — Encounter: Payer: Self-pay | Admitting: Diagnostic Neuroimaging

## 2015-10-05 ENCOUNTER — Ambulatory Visit (INDEPENDENT_AMBULATORY_CARE_PROVIDER_SITE_OTHER): Payer: BLUE CROSS/BLUE SHIELD | Admitting: Diagnostic Neuroimaging

## 2015-10-05 VITALS — BP 129/82 | HR 94 | Ht 62.0 in | Wt 150.0 lb

## 2015-10-05 DIAGNOSIS — F0390 Unspecified dementia without behavioral disturbance: Secondary | ICD-10-CM | POA: Diagnosis not present

## 2015-10-05 DIAGNOSIS — F03A0 Unspecified dementia, mild, without behavioral disturbance, psychotic disturbance, mood disturbance, and anxiety: Secondary | ICD-10-CM

## 2015-10-05 DIAGNOSIS — R413 Other amnesia: Secondary | ICD-10-CM | POA: Diagnosis not present
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
## 2015-10-05 NOTE — Patient Instructions (Signed)
Thank you for coming to see [place] at [facility]. I hope we have been able to provide you high quality care today.  You may receive a patient satisfaction survey over the next few weeks. We would appreciate your feedback and comments so that we may continue to improve ourselves and the health of our patients.   PLAN: - I will check MRI brain - consider donepezil or memantine (medications to help slow down progression of memory loss) - consider CREAD research study - caution with driving, stove, finances, checkbook - review website: CapitalMile.co.nz   ~~~~~~~~~~~~~~~~~~~~~~~~~~~~~~~~~~~~~~~~~~~~~~~~~~~~~~~~~~~~~~~~~  DR. Lyndsie Wallman'S GUIDE TO HAPPY AND HEALTHY LIVING These are some of my general health and wellness recommendations. Some of them may apply to you better than others. Please use common sense as you try these suggestions and feel free to ask me any questions.   ACTIVITY/FITNESS Mental, social, emotional and physical stimulation are very important for brain and body health. Try learning a new activity (arts, music, language, sports, games).  Keep moving your body to the best of your abilities. You can do this at home, inside or outside, the park, community center, gym or anywhere you like. Consider a physical therapist or personal trainer to get started. Consider the app Sworkit. Fitness trackers such as smart-watches, smart-phones or Fitbits can help as well.   NUTRITION Eat more plants: colorful vegetables, nuts, seeds and berries.  Eat less sugar, salt, preservatives and processed foods.  Avoid toxins such as cigarettes and alcohol.  Drink water when you are thirsty. Warm water with a slice of lemon is an excellent morning drink to start the day.  Consider these websites for more information The Nutrition Source (https://www.henry-hernandez.biz/) Precision Nutrition (WindowBlog.ch)   RELAXATION Consider  practicing mindfulness meditation or other relaxation techniques such as deep breathing, prayer, yoga, tai chi, massage. See website mindful.org or the apps Headspace or Calm to help get started.   SLEEP Try to get at least 7-8+ hours sleep per day. Regular exercise and reduced caffeine will help you sleep better. Practice good sleep hygeine techniques. See website sleep.org for more information.   PLANNING Prepare estate planning, living will, healthcare POA documents. Sometimes this is best planned with the help of an attorney. Theconversationproject.org and agingwithdignity.org are excellent resources.

## 2015-11-23 ENCOUNTER — Ambulatory Visit: Payer: BLUE CROSS/BLUE SHIELD | Admitting: Diagnostic Neuroimaging

## 2015-12-15 ENCOUNTER — Ambulatory Visit: Payer: BLUE CROSS/BLUE SHIELD | Admitting: Diagnostic Neuroimaging

## 2017-02-13 ENCOUNTER — Other Ambulatory Visit: Payer: Self-pay | Admitting: Nephrology

## 2017-02-13 DIAGNOSIS — N184 Chronic kidney disease, stage 4 (severe): Secondary | ICD-10-CM

## 2017-02-27 ENCOUNTER — Ambulatory Visit
Admission: RE | Admit: 2017-02-27 | Discharge: 2017-02-27 | Disposition: A | Discharge status: Home / Self Care | Payer: Medicare Other | Source: Ambulatory Visit | Attending: Nephrology | Admitting: Nephrology

## 2017-02-27 DIAGNOSIS — N184 Chronic kidney disease, stage 4 (severe): Secondary | ICD-10-CM

## 2018-09-03 DIAGNOSIS — K449 Diaphragmatic hernia without obstruction or gangrene: Secondary | ICD-10-CM

## 2018-09-03 DIAGNOSIS — I700 Atherosclerosis of aorta: Secondary | ICD-10-CM

## 2018-09-03 DIAGNOSIS — K5790 Diverticulosis of intestine, part unspecified, without perforation or abscess without bleeding: Secondary | ICD-10-CM

## 2018-09-03 DIAGNOSIS — N949 Unspecified condition associated with female genital organs and menstrual cycle: Secondary | ICD-10-CM

## 2018-09-03 DIAGNOSIS — K838 Other specified diseases of biliary tract: Secondary | ICD-10-CM

## 2018-09-03 HISTORY — DX: Diverticulosis of intestine, part unspecified, without perforation or abscess without bleeding: K57.90

## 2018-09-03 HISTORY — DX: Unspecified condition associated with female genital organs and menstrual cycle: N94.9

## 2018-09-03 HISTORY — DX: Other specified diseases of biliary tract: K83.8

## 2018-09-03 HISTORY — DX: Atherosclerosis of aorta: I70.0

## 2018-09-03 HISTORY — DX: Diaphragmatic hernia without obstruction or gangrene: K44.9

## 2018-09-10 ENCOUNTER — Other Ambulatory Visit: Payer: Self-pay

## 2018-09-11 ENCOUNTER — Encounter: Payer: Self-pay | Admitting: Gastroenterology

## 2018-09-11 ENCOUNTER — Ambulatory Visit (INDEPENDENT_AMBULATORY_CARE_PROVIDER_SITE_OTHER): Payer: Medicare Other | Admitting: Gastroenterology

## 2018-09-11 ENCOUNTER — Other Ambulatory Visit: Payer: Self-pay | Admitting: Gastroenterology

## 2018-09-11 ENCOUNTER — Other Ambulatory Visit: Payer: Self-pay

## 2018-09-11 VITALS — Ht 61.5 in | Wt 162.0 lb

## 2018-09-11 DIAGNOSIS — R1084 Generalized abdominal pain: Secondary | ICD-10-CM | POA: Diagnosis not present

## 2018-09-11 DIAGNOSIS — R112 Nausea with vomiting, unspecified: Secondary | ICD-10-CM

## 2018-09-11 DIAGNOSIS — R945 Abnormal results of liver function studies: Secondary | ICD-10-CM

## 2018-09-11 DIAGNOSIS — R7989 Other specified abnormal findings of blood chemistry: Secondary | ICD-10-CM

## 2018-09-11 DIAGNOSIS — K529 Noninfective gastroenteritis and colitis, unspecified: Secondary | ICD-10-CM | POA: Diagnosis not present

## 2018-09-11 NOTE — Patient Instructions (Signed)
You have been scheduled for a CT scan of the abdomen and pelvis at [facility]   You are scheduled on 09/17/18 at [place] should arrive 15 minutes prior to your appointment time for registration. Please follow the written instructions below on the day of your exam:  WARNING: IF YOU ARE ALLERGIC TO IODINE/X-RAY DYE, PLEASE NOTIFY RADIOLOGY IMMEDIATELY AT [phone]! YOU WILL BE GIVEN A 13 HOUR PREMEDICATION PREP.  1) Do not eat or drink anything after 5am (4 hours prior to your test) 2) You have been given 2 bottles of oral contrast to drink. The solution may taste better if refrigerated, but do NOT add ice or any other liquid to this solution. Shake well before drinking.    Drink 1 bottle of contrast @ 7am (2 hours prior to your exam)  Drink 1 bottle of contrast @ 8am (1 hour prior to your exam)  You may take any medications as prescribed with a small amount of water, if necessary. If you take any of the following medications: METFORMIN, GLUCOPHAGE, GLUCOVANCE, AVANDAMET, RIOMET, FORTAMET, [place] MET, JANUMET, GLUMETZA or METAGLIP, you MAY be asked to HOLD this medication 48 hours AFTER the exam.  The purpose of you drinking the oral contrast is to aid in the visualization of your intestinal tract. The contrast solution may cause some diarrhea. Depending on your individual set of symptoms, you may also receive an intravenous injection of x-ray contrast/dye. Plan on being at [facility] for 30 minutes or longer, depending on the type of exam you are having performed.  This test typically takes 30-45 minutes to complete.  Your provider has requested that you go to the basement level for lab worky. Press "B" on the elevator. The lab is located at the first door on the left as you exit the elevator.   Thank you for entrusting me with your care and choosing [facility].  Dr  Loletha Carrow   ________________________________________________________________________

## 2018-09-11 NOTE — H&P (View-Only) (Signed)
This patient contacted our office requesting a physician telemedicine consultation regarding clinical questions and/or test results.  If new patient, they were referred by PCP noted below  Participants on the conference : myself and patient her daughter-in-law Alejandra Espinoza  The patient consented to this consultation and was aware that a charge will be placed through their insurance.  I was in my office and the patient was at home   Encounter time:  Total time 45 minutes, with 31 minutes spent with patient on Doximity   _____________________________________________________________________________________________              [facility] Consult [facility]:  History: Alejandra Espinoza 09/11/2018  Referring provider: Dr. Georgianne FickAjith Espinoza [facility]  Reason for consult/chief complaint: Generalized abdominal pain  Subjective  HPI:  This is a 78 year old woman for by primary care for worsening abdominal pain, nausea, elevated LFTs and anemia.  Alejandra Espinoza is a poor historian, and can only tell me "my stomach hurts".  She participated briefly in this teleconference, then lay on the couch because she was not feeling well.  Her daughter-in-law Alejandra Espinoza gave most of the history, I gleaned more from the primary care note and more from epic computer chart review  For about the last 2 months Alejandra Espinoza has had chronic generalized abdominal discomfort, seemingly centered around the periumbilical area, at times episodically worsening with associated nausea and vomiting.  Sometimes she will be well for a couple of days, then have episodic worsening.  There is also chronic diarrhea with multiple loose nonbloody stools per day "that smells like death".  There has not been any recent antibiotic use travel or sick contacts.  Primary care note and work-up was reviewed.  There was no reported tenderness on exam that day, though Alejandra Espinoza states that when [name] had her ultrasound, her entire  abdominal wall was tender to the ultrasound probe. She also seems to recall what sounds like possibly a gastric emptying study years ago, but I do not find a report of that in our system.  She has not previously been seen by this practice, and they do not recall any other prior GI evaluation.  ROS:  Review of Systems Alejandra Espinoza is not able to give sufficient history for complete review of systems, though it was attempted. She reports fatigue and "I do not feel well". Alejandra Espinoza reports there is been about a 10 pound weight loss in the last 6 to 8 weeks.  Appetite has been poor.  Past Medical History: Past Medical History:  Diagnosis Date   AKI (acute kidney injury) (HCC)    Arthritis    Diabetes mellitus without complication (HCC)    Duodenitis    DVT prophylaxis    Esophageal stricture    GERD (gastroesophageal reflux disease)    Hiatal hernia    Hyperlipidemia    Hypertension    Thyroid disease    Admissions for hyperglycemia and DKA 2016-2018  Past Surgical History: Past Surgical History:  Procedure Laterality Date   DG GALL BLADDER       Family History: Family History  Problem Relation Age of Onset   Hypertension Father     Social History: Social History   Socioeconomic History   Marital status: Married    Spouse name: Not on file   Number of children: 2   Years of education: 11   Highest education level: Not on file  Occupational History   Not on file  Social Needs   Financial resource strain: Not on file  Food insecurity:  °  Worry: Not on file  °  Inability: Not on file  °• Transportation needs:  °  Medical: Not on file  °  Non-medical: Not on file  °Tobacco Use  °• Smoking status: Never Smoker  °• Smokeless tobacco: Never Used  °Substance and Sexual Activity  °• Alcohol use: No  °• Drug use: No  °• Sexual activity: Not on file  °Lifestyle  °• Physical activity:  °  Days per week: Not on file  °  Minutes per session: Not on file  °• Stress: Not  on file  °Relationships  °• Social connections:  °  Talks on phone: Not on file  °  Gets together: Not on file  °  Attends religious service: Not on file  °  Active member of club or organization: Not on file  °  Attends meetings of clubs or organizations: Not on file  °  Relationship status: Not on file  °Other Topics Concern  °• Not on file  °Social History Narrative  ° Lives with son and daughter in law  ° Caffeine use:  Soda( ocass) / coffee (once per week)  ° 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     °• amLODipine (NORVASC) 5 MG tablet     °• cefaclor (CECLOR) 500 MG capsule     °• HUMALOG KWIKPEN 100 UNIT/ML KwikPen Sliding scale    °• leflunomide (ARAVA) 10 MG tablet Take 10 mg by mouth daily.    °• losartan (COZAAR) 25 MG tablet Take 25 mg by mouth daily.    °• methimazole (TAPAZOLE) 5 MG tablet     °• MYRBETRIQ 25 MG TB24 tablet     °• pantoprazole (PROTONIX) 40 MG tablet Take 1 tablet (40 mg total) by mouth 2 (two) times daily. 60 tablet 1  °• TOUJEO SOLOSTAR 300 UNIT/ML SOPN     °• TRULICITY 1.5 MG/0.5ML SOPN     °• vitamin B-12 (CYANOCOBALAMIN) 500 MCG tablet Take 500 mcg by mouth daily.    ° °No current facility-administered medications for this visit.   ° ° ° ° °___________________________________________________________________ °Objective  ° °Exam: °· No exam-virtual visit.  She looks fatigued but nontoxic.  She is not vomiting. ° °Labs: ° °Primary care labs: November 2019 hemoglobin A1c 7.8, May 28 that was 6.9 ° °08/30/2018, GGT markedly elevated at 825 °Total bilirubin 1.6 alkaline phosphatase 343, AST 33, ALT 64 albumin 3.9 °Iron 57, 27% saturation °B12 normal °Free T4 1.5 TSH, 2.8 ° °From November  2019, primary care labs show normal LFTs ° °Last hepatic function panel on file in [facility] epic system is from March 2016: AST 14, ALT 79, alkaline phosphatase and bilirubin normal ° °On 08/13/2018, alkaline phosphatase 341, ALT 85, AST 45, total bilirubin 1.9 °WBC 5.8, hemoglobin 10.5, hematocrit 32, MCV 93, RDW normal at 14, 302 ° °Calcium 9.2, bicarb 20, chloride 101, creatinine 2.07 (2.82 in February 2020) sodium 135, potassium 4.9 °Patient has a hemoglobin 11.1 in August 2019 and 11.11 May 2018 ° °Abdominal ultrasound 08/30/2018 with [facility] describes diffusely hyperechoic hepatic parenchyma, felt to reflect steatosis.  Normal reported morphology without mass lesion or intrahepatic biliary ductal dilatation. °  Normal portal venous flow. Status post cholecystectomy and 8 mm CBD.  Pancreas not well seen by overlying bowel gas, bilateral renal cortical atrophy consistent with medical renal disease.  Spleen reportedly normal.  Radiologic Studies:  [place] as above  2016 [facility] report on file:  CLINICAL DATA:  Abdominal pain all over for 3 days.  Vomiting.   EXAM: CT ABDOMEN AND PELVIS WITHOUT CONTRAST   TECHNIQUE: Multidetector CT imaging of the abdomen and pelvis was performed following the standard protocol without IV contrast.   COMPARISON:  None.   FINDINGS: BODY WALL: No contributory findings.   LOWER CHEST: Moderate sliding hiatal hernia.   ABDOMEN/PELVIS:   Liver: No focal abnormality.   Biliary: Cholecystectomy.   Pancreas: Unremarkable.   Spleen: Unremarkable.   Adrenals: Unremarkable.   Kidneys and ureters: No hydronephrosis or stone.   Bladder: Unremarkable.   Reproductive: Hysterectomy.  No pathologic findings.   Bowel: Marked thickening of the gastric antrum and proximal duodenum with surrounding fat infiltration. There is a gas-filled channel along the anterior aspect of the first second portion duodenum compatible with ulcer. No evidence for  abscess or pneumoperitoneum. Appendectomy. There is extensive colonic diverticulosis. No bowel obstruction.   Retroperitoneum: No mass or adenopathy.   Peritoneum: No ascites or pneumoperitoneum.   Vascular: No acute abnormality.   OSSEOUS: No acute abnormalities.   IMPRESSION: 1. Antritis and proximal duodenitis with probable duodenal ulcer. After treatment, recommend imaging or endoscopic follow-up to ensure normalization. 2. Moderate hiatal hernia. 3. Colonic diverticulosis.     Electronically Signed   By: Monte Fantasia M.D.   On: 06/20/2014 14:27   Assessment: Encounter Diagnoses  Name Primary?   Generalized abdominal pain Yes   Nausea and vomiting in adult    Chronic diarrhea    LFTs abnormal     2 months of persistent generalized abdominal pain, episodic nausea and vomiting when the pain is worse, new onset of cholestatic appearing LFTs.  Also potentially worrisome for some infiltrative or metastatic disease in the liver.  It is unclear how the diarrhea fits into the picture at this point.   The anemia is most likely related to chronic kidney disease.  I am very concerned about this constellation of symptoms, especially given the associated 10 pound weight loss. The significance of prior CT scan findings and relation to the symptoms is unknown but should be considered for the possibility of something obstructive or neoplastic in the digestive tract.  Plan:  ASAP scheduling of CT scan abdomen and pelvis with oral contrast only generalized abdominal pain, weight loss, nausea and vomiting and elevated LFTs.  Stool studies for C. difficile  Thank you for the courtesy of this consult.  Please call me with any questions or concerns.  Nelida Meuse III  CC: Referring provider noted above

## 2018-09-11 NOTE — Progress Notes (Signed)
This patient contacted our office requesting a physician telemedicine consultation regarding clinical questions and/or test results.  If new patient, they were referred by PCP noted below  Participants on the conference : myself and patient her daughter-in-law Alejandra Espinoza  The patient consented to this consultation and was aware that a charge will be placed through their insurance.  I was in my office and the patient was at home   Encounter time:  Total time 45 minutes, with 31 minutes spent with patient on Doximity   _____________________________________________________________________________________________              [facility] Consult Note:  History: Alejandra Espinoza 09/11/2018  Referring provider: Dr. Georgianne FickAjith Ramachandran [facility]  Reason for consult/chief complaint: Generalized abdominal pain  Subjective  HPI:  This is a 78 year old woman for by primary care for worsening abdominal pain, nausea, elevated LFTs and anemia.  Alejandra Espinoza is a poor historian, and can only tell me "my stomach hurts".  She participated briefly in this teleconference, then lay on the couch because she was not feeling well.  Her daughter-in-law Alejandra Espinoza gave most of the history, I gleaned more from the primary care note and more from epic computer chart review  For about the last 2 months Alejandra Espinoza has had chronic generalized abdominal discomfort, seemingly centered around the periumbilical area, at times episodically worsening with associated nausea and vomiting.  Sometimes she will be well for a couple of days, then have episodic worsening.  There is also chronic diarrhea with multiple loose nonbloody stools per day "that smells like death".  There has not been any recent antibiotic use travel or sick contacts.  Primary care note and work-up was reviewed.  There was no reported tenderness on exam that day, though Alejandra Espinoza states that when [name] had her ultrasound, her entire  abdominal wall was tender to the ultrasound probe. She also seems to recall what sounds like possibly a gastric emptying study years ago, but I do not find a report of that in our system.  She has not previously been seen by this practice, and they do not recall any other prior GI evaluation.  ROS:  Review of Systems Alejandra Espinoza is not able to give sufficient history for complete review of systems, though it was attempted. She reports fatigue and "I do not feel well". Alejandra Espinoza reports there is been about a 10 pound weight loss in the last 6 to 8 weeks.  Appetite has been poor.  Past Medical History: Past Medical History:  Diagnosis Date   AKI (acute kidney injury) (HCC)    Arthritis    Diabetes mellitus without complication (HCC)    Duodenitis    DVT prophylaxis    Esophageal stricture    GERD (gastroesophageal reflux disease)    Hiatal hernia    Hyperlipidemia    Hypertension    Thyroid disease    Admissions for hyperglycemia and DKA 2016-2018  Past Surgical History: Past Surgical History:  Procedure Laterality Date   DG GALL BLADDER       Family History: Family History  Problem Relation Age of Onset   Hypertension Father     Social History: Social History   Socioeconomic History   Marital status: Married    Spouse name: Not on file   Number of children: 2   Years of education: 11   Highest education level: Not on file  Occupational History   Not on file  Social Needs   Financial resource strain: Not on file  Food insecurity:    Worry: Not on file    Inability: Not on file   Transportation needs:    Medical: Not on file    Non-medical: Not on file  Tobacco Use   Smoking status: Never Smoker   Smokeless tobacco: Never Used  Substance and Sexual Activity   Alcohol use: No   Drug use: No   Sexual activity: Not on file  Lifestyle   Physical activity:    Days per week: Not on file    Minutes per session: Not on file   Stress: Not  on file  Relationships   Social connections:    Talks on phone: Not on file    Gets together: Not on file    Attends religious service: Not on file    Active member of club or organization: Not on file    Attends meetings of clubs or organizations: Not on file    Relationship status: Not on file  Other Topics Concern   Not on file  Social History Narrative   Lives with son and daughter in law   Caffeine use:  Soda( ocass) / coffee (once per week)   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      amLODipine (NORVASC) 5 MG tablet      cefaclor (CECLOR) 500 MG capsule      HUMALOG KWIKPEN 100 UNIT/ML KwikPen Sliding scale     leflunomide (ARAVA) 10 MG tablet Take 10 mg by mouth daily.     losartan (COZAAR) 25 MG tablet Take 25 mg by mouth daily.     methimazole (TAPAZOLE) 5 MG tablet      MYRBETRIQ 25 MG TB24 tablet      pantoprazole (PROTONIX) 40 MG tablet Take 1 tablet (40 mg total) by mouth 2 (two) times daily. 60 tablet 1   TOUJEO SOLOSTAR 300 UNIT/ML SOPN      TRULICITY 1.5 MG/0.5ML SOPN      vitamin B-12 (CYANOCOBALAMIN) 500 MCG tablet Take 500 mcg by mouth daily.     No current facility-administered medications for this visit.       ___________________________________________________________________ Objective   Exam:  No exam-virtual visit.  She looks fatigued but nontoxic.  She is not vomiting.  Labs:  Primary care labs: November 2019 hemoglobin A1c 7.8, May 28 that was 6.9  08/30/2018, GGT markedly elevated at 825 Total bilirubin 1.6 alkaline phosphatase 343, AST 33, ALT 64 albumin 3.9 Iron 57, 27% saturation B12 normal Free T4 1.5 TSH, 2.8  From November  2019, primary care labs show normal LFTs  Last hepatic function panel on file in [place] epic system is from March 2016: AST 14, ALT 79, alkaline phosphatase and bilirubin normal  On 08/13/2018, alkaline phosphatase 341, ALT 85, AST 45, total bilirubin 1.9 WBC 5.8, hemoglobin 10.5, hematocrit 32, MCV 93, RDW normal at 14, 302  Calcium 9.2, bicarb 20, chloride 101, creatinine 2.07 (2.82 in February 2020) sodium 135, potassium 4.9 Patient has a hemoglobin 11.1 in August 2019 and 11.11 May 2018  Abdominal [facility] describes diffusely hyperechoic hepatic parenchyma, felt to reflect steatosis.  Normal reported morphology without mass lesion or intrahepatic biliary ductal dilatation.  Normal portal venous flow. Status post cholecystectomy and 8 mm CBD.  Pancreas not well seen by overlying bowel gas, bilateral renal cortical atrophy consistent with medical renal disease.  Spleen reportedly normal.  Radiologic Studies:  [place] as above  2016 [place] [facility] report on file:  CLINICAL DATA:  Abdominal pain all over for 3 days.  Vomiting.   EXAM: CT ABDOMEN AND PELVIS WITHOUT CONTRAST   TECHNIQUE: Multidetector CT imaging of the abdomen and pelvis was performed following the standard protocol without IV contrast.   COMPARISON:  None.   FINDINGS: BODY WALL: No contributory findings.   LOWER CHEST: Moderate sliding hiatal hernia.   ABDOMEN/PELVIS:   Liver: No focal abnormality.   Biliary: Cholecystectomy.   Pancreas: Unremarkable.   Spleen: Unremarkable.   Adrenals: Unremarkable.   Kidneys and ureters: No hydronephrosis or stone.   Bladder: Unremarkable.   Reproductive: Hysterectomy.  No pathologic findings.   Bowel: Marked thickening of the gastric antrum and proximal duodenum with surrounding fat infiltration. There is a gas-filled channel along the anterior aspect of the first second portion duodenum compatible with ulcer. No evidence for  abscess or pneumoperitoneum. Appendectomy. There is extensive colonic diverticulosis. No bowel obstruction.   Retroperitoneum: No mass or adenopathy.   Peritoneum: No ascites or pneumoperitoneum.   Vascular: No acute abnormality.   OSSEOUS: No acute abnormalities.   IMPRESSION: 1. Antritis and proximal duodenitis with probable duodenal ulcer. After treatment, recommend imaging or endoscopic follow-up to ensure normalization. 2. Moderate hiatal hernia. 3. Colonic diverticulosis.     Electronically Signed   By: Monte Fantasia M.D.   On: 06/20/2014 14:27   Assessment: Encounter Diagnoses  Name Primary?   Generalized abdominal pain Yes   Nausea and vomiting in adult    Chronic diarrhea    LFTs abnormal     2 months of persistent generalized abdominal pain, episodic nausea and vomiting when the pain is worse, new onset of cholestatic appearing LFTs.  Also potentially worrisome for some infiltrative or metastatic disease in the liver.  It is unclear how the diarrhea fits into the picture at this point.   The anemia is most likely related to chronic kidney disease.  I am very concerned about this constellation of symptoms, especially given the associated 10 pound weight loss. The significance of prior CT scan findings and relation to the symptoms is unknown but should be considered for the possibility of something obstructive or neoplastic in the digestive tract.  Plan:  ASAP scheduling of CT scan abdomen and pelvis with oral contrast only generalized abdominal pain, weight loss, nausea and vomiting and elevated LFTs.  Stool studies for C. difficile  Thank you for the courtesy of this consult.  Please call me with any questions or concerns.  Nelida Meuse III  CC: Referring provider noted above

## 2018-09-11 NOTE — Addendum Note (Signed)
Addended by: Grace Bushy A on: 09/11/2018 03:13 PM   Modules accepted: Orders

## 2018-09-17 ENCOUNTER — Ambulatory Visit
Admission: RE | Admit: 2018-09-17 | Discharge: 2018-09-17 | Disposition: A | Discharge status: Home / Self Care | Payer: Medicare Other | Source: Ambulatory Visit | Attending: Gastroenterology | Admitting: Gastroenterology

## 2018-09-17 DIAGNOSIS — R945 Abnormal results of liver function studies: Secondary | ICD-10-CM

## 2018-09-17 DIAGNOSIS — R112 Nausea with vomiting, unspecified: Secondary | ICD-10-CM

## 2018-09-17 DIAGNOSIS — R1084 Generalized abdominal pain: Secondary | ICD-10-CM

## 2018-09-17 DIAGNOSIS — K529 Noninfective gastroenteritis and colitis, unspecified: Secondary | ICD-10-CM

## 2018-09-17 DIAGNOSIS — R7989 Other specified abnormal findings of blood chemistry: Secondary | ICD-10-CM

## 2018-09-18 ENCOUNTER — Other Ambulatory Visit: Payer: Self-pay

## 2018-09-18 ENCOUNTER — Telehealth: Payer: Self-pay

## 2018-09-18 DIAGNOSIS — R1084 Generalized abdominal pain: Secondary | ICD-10-CM

## 2018-09-18 DIAGNOSIS — K529 Noninfective gastroenteritis and colitis, unspecified: Secondary | ICD-10-CM

## 2018-09-18 DIAGNOSIS — R112 Nausea with vomiting, unspecified: Secondary | ICD-10-CM

## 2018-09-18 NOTE — Telephone Encounter (Signed)
Spoke with daughter in [employer]. She picked up the the stool collection kit just yesterday. She plans on collecting at her next BM. Added the O&P.

## 2018-09-18 NOTE — Telephone Encounter (Signed)
-----   Message from Coyle, MD sent at 09/18/2018  8:00 AM EDT ----- I am in the process of reviewing yesterday's CT scan with radiology, and will contact patient/family about it today or tomorrow.    But it doesn't seem the C diff test done.    Please contact son today and try to make that happen.  If they have not yet submitted specimen, please add Ova and parasites as well.  - HD

## 2018-09-20 ENCOUNTER — Other Ambulatory Visit: Payer: Self-pay

## 2018-09-20 DIAGNOSIS — K831 Obstruction of bile duct: Secondary | ICD-10-CM

## 2018-09-24 ENCOUNTER — Other Ambulatory Visit (HOSPITAL_COMMUNITY)
Admission: RE | Admit: 2018-09-24 | Discharge: 2018-09-24 | Disposition: A | Discharge status: Home / Self Care | Payer: Medicare Other | Source: Ambulatory Visit | Attending: Gastroenterology | Admitting: Gastroenterology

## 2018-09-24 DIAGNOSIS — Z1159 Encounter for screening for other viral diseases: Secondary | ICD-10-CM | POA: Insufficient documentation

## 2018-09-24 LAB — SARS CORONAVIRUS 2 (TAT 6-24 HRS): SARS Coronavirus 2: NEGATIVE

## 2018-09-25 ENCOUNTER — Encounter (HOSPITAL_COMMUNITY): Payer: Self-pay

## 2018-09-26 NOTE — Progress Notes (Signed)
Unable to reach Alejandra Espinoza by telephone to obtain health history.

## 2018-09-27 ENCOUNTER — Ambulatory Visit (HOSPITAL_COMMUNITY): Payer: Medicare Other | Admitting: Anesthesiology

## 2018-09-27 ENCOUNTER — Ambulatory Visit (HOSPITAL_COMMUNITY)
Admission: RE | Admit: 2018-09-27 | Discharge: 2018-09-27 | Disposition: A | Discharge status: Home / Self Care | Payer: Medicare Other | Attending: Gastroenterology | Admitting: Gastroenterology

## 2018-09-27 ENCOUNTER — Ambulatory Visit (HOSPITAL_COMMUNITY): Payer: Medicare Other

## 2018-09-27 ENCOUNTER — Encounter (HOSPITAL_COMMUNITY)
Admission: RE | Disposition: A | Discharge status: Home / Self Care | Payer: Self-pay | Source: Home / Self Care | Attending: Gastroenterology

## 2018-09-27 ENCOUNTER — Encounter (HOSPITAL_COMMUNITY): Payer: Self-pay | Admitting: *Deleted

## 2018-09-27 DIAGNOSIS — E079 Disorder of thyroid, unspecified: Secondary | ICD-10-CM | POA: Insufficient documentation

## 2018-09-27 DIAGNOSIS — I10 Essential (primary) hypertension: Secondary | ICD-10-CM | POA: Insufficient documentation

## 2018-09-27 DIAGNOSIS — F0390 Unspecified dementia without behavioral disturbance: Secondary | ICD-10-CM | POA: Insufficient documentation

## 2018-09-27 DIAGNOSIS — E119 Type 2 diabetes mellitus without complications: Secondary | ICD-10-CM | POA: Diagnosis not present

## 2018-09-27 DIAGNOSIS — M1990 Unspecified osteoarthritis, unspecified site: Secondary | ICD-10-CM | POA: Insufficient documentation

## 2018-09-27 DIAGNOSIS — D649 Anemia, unspecified: Secondary | ICD-10-CM | POA: Insufficient documentation

## 2018-09-27 DIAGNOSIS — Z888 Allergy status to other drugs, medicaments and biological substances status: Secondary | ICD-10-CM | POA: Diagnosis not present

## 2018-09-27 DIAGNOSIS — Z9049 Acquired absence of other specified parts of digestive tract: Secondary | ICD-10-CM | POA: Insufficient documentation

## 2018-09-27 DIAGNOSIS — K219 Gastro-esophageal reflux disease without esophagitis: Secondary | ICD-10-CM | POA: Diagnosis not present

## 2018-09-27 DIAGNOSIS — Z794 Long term (current) use of insulin: Secondary | ICD-10-CM | POA: Insufficient documentation

## 2018-09-27 DIAGNOSIS — K529 Noninfective gastroenteritis and colitis, unspecified: Secondary | ICD-10-CM | POA: Diagnosis not present

## 2018-09-27 DIAGNOSIS — K8050 Calculus of bile duct without cholangitis or cholecystitis without obstruction: Secondary | ICD-10-CM | POA: Diagnosis present

## 2018-09-27 DIAGNOSIS — Z8249 Family history of ischemic heart disease and other diseases of the circulatory system: Secondary | ICD-10-CM | POA: Diagnosis not present

## 2018-09-27 DIAGNOSIS — K5730 Diverticulosis of large intestine without perforation or abscess without bleeding: Secondary | ICD-10-CM | POA: Insufficient documentation

## 2018-09-27 DIAGNOSIS — Z79899 Other long term (current) drug therapy: Secondary | ICD-10-CM | POA: Insufficient documentation

## 2018-09-27 DIAGNOSIS — K449 Diaphragmatic hernia without obstruction or gangrene: Secondary | ICD-10-CM | POA: Diagnosis not present

## 2018-09-27 DIAGNOSIS — K831 Obstruction of bile duct: Secondary | ICD-10-CM

## 2018-09-27 HISTORY — PX: REMOVAL OF STONES: SHX5545

## 2018-09-27 HISTORY — DX: Other specified abnormal findings of blood chemistry: R79.89

## 2018-09-27 HISTORY — DX: Unspecified dementia, unspecified severity, without behavioral disturbance, psychotic disturbance, mood disturbance, and anxiety: F03.90

## 2018-09-27 HISTORY — DX: Other amnesia: R41.3

## 2018-09-27 HISTORY — PX: ENDOSCOPIC RETROGRADE CHOLANGIOPANCREATOGRAPHY (ERCP) WITH PROPOFOL: SHX5810

## 2018-09-27 HISTORY — DX: Noninfective gastroenteritis and colitis, unspecified: K52.9

## 2018-09-27 HISTORY — DX: Rheumatoid arthritis, unspecified: M06.9

## 2018-09-27 HISTORY — PX: SPHINCTEROTOMY: SHX5544

## 2018-09-27 HISTORY — DX: Anemia, unspecified: D64.9

## 2018-09-27 HISTORY — DX: Abnormal results of liver function studies: R94.5

## 2018-09-27 LAB — POCT I-STAT 4, (NA,K, GLUC, HGB,HCT)
Glucose, Bld: 162 mg/dL — ABNORMAL HIGH (ref 70–99)
HCT: 34 % — ABNORMAL LOW (ref 36.0–46.0)
Hemoglobin: 11.6 g/dL — ABNORMAL LOW (ref 12.0–15.0)
Potassium: 4.7 mmol/L (ref 3.5–5.1)
Sodium: 134 mmol/L — ABNORMAL LOW (ref 135–145)

## 2018-09-27 LAB — GLUCOSE, CAPILLARY: Glucose-Capillary: 154 mg/dL — ABNORMAL HIGH (ref 70–99)

## 2018-09-27 SURGERY — ENDOSCOPIC RETROGRADE CHOLANGIOPANCREATOGRAPHY (ERCP) WITH PROPOFOL
Anesthesia: General

## 2018-09-27 MED ORDER — CIPROFLOXACIN IN D5W 400 MG/200ML IV SOLN
INTRAVENOUS | Status: AC
  Filled 2018-09-27: qty 200

## 2018-09-27 MED ORDER — GLUCAGON HCL RDNA (DIAGNOSTIC) 1 MG IJ SOLR
INTRAMUSCULAR | Status: AC
  Filled 2018-09-27: qty 1

## 2018-09-27 MED ORDER — MIDAZOLAM HCL 2 MG/2ML IJ SOLN
INTRAMUSCULAR | Status: AC
  Filled 2018-09-27: qty 2

## 2018-09-27 MED ORDER — EPHEDRINE SULFATE-NACL 50-0.9 MG/10ML-% IV SOSY
PREFILLED_SYRINGE | INTRAVENOUS | Status: DC | PRN
  Administered 2018-09-27: 10 mg via INTRAVENOUS

## 2018-09-27 MED ORDER — PHENYLEPHRINE 40 MCG/ML (10ML) SYRINGE FOR IV PUSH (FOR BLOOD PRESSURE SUPPORT)
PREFILLED_SYRINGE | INTRAVENOUS | Status: DC | PRN
  Administered 2018-09-27: 120 ug via INTRAVENOUS
  Administered 2018-09-27: 80 ug via INTRAVENOUS
  Administered 2018-09-27 (×3): 120 ug via INTRAVENOUS
  Administered 2018-09-27: 200 ug via INTRAVENOUS

## 2018-09-27 MED ORDER — FENTANYL CITRATE (PF) 100 MCG/2ML IJ SOLN
INTRAMUSCULAR | Status: AC
  Filled 2018-09-27: qty 2

## 2018-09-27 MED ORDER — CIPROFLOXACIN IN D5W 400 MG/200ML IV SOLN
INTRAVENOUS | Status: DC | PRN
  Administered 2018-09-27: 400 mg via INTRAVENOUS

## 2018-09-27 MED ORDER — INDOMETHACIN 50 MG RE SUPP
RECTAL | Status: DC | PRN
  Administered 2018-09-27: 100 mg via RECTAL

## 2018-09-27 MED ORDER — LACTATED RINGERS IV SOLN
INTRAVENOUS | Status: DC
  Administered 2018-09-27: 12:00:00 via INTRAVENOUS

## 2018-09-27 MED ORDER — PROPOFOL 10 MG/ML IV BOLUS
INTRAVENOUS | Status: DC | PRN
  Administered 2018-09-27: 100 mg via INTRAVENOUS

## 2018-09-27 MED ORDER — SODIUM CHLORIDE 0.9 % IV SOLN
INTRAVENOUS | Status: DC | PRN
  Administered 2018-09-27: 14:00:00 30 mL

## 2018-09-27 MED ORDER — INDOMETHACIN 50 MG RE SUPP
RECTAL | Status: AC
  Filled 2018-09-27: qty 2

## 2018-09-27 MED ORDER — LIDOCAINE 2% (20 MG/ML) 5 ML SYRINGE
INTRAMUSCULAR | Status: DC | PRN
  Administered 2018-09-27: 40 mg via INTRAVENOUS

## 2018-09-27 MED ORDER — SODIUM CHLORIDE 0.9 % IV SOLN: INTRAVENOUS | Status: DC

## 2018-09-27 MED ORDER — SUCCINYLCHOLINE CHLORIDE 200 MG/10ML IV SOSY
PREFILLED_SYRINGE | INTRAVENOUS | Status: DC | PRN
  Administered 2018-09-27: 140 mg via INTRAVENOUS

## 2018-09-27 MED ORDER — ONDANSETRON HCL 4 MG/2ML IJ SOLN
INTRAMUSCULAR | Status: DC | PRN
  Administered 2018-09-27: 4 mg via INTRAVENOUS

## 2018-09-27 MED ORDER — FENTANYL CITRATE (PF) 250 MCG/5ML IJ SOLN: INTRAMUSCULAR | Status: DC | PRN

## 2018-09-27 MED ORDER — DEXAMETHASONE SODIUM PHOSPHATE 10 MG/ML IJ SOLN
INTRAMUSCULAR | Status: DC | PRN
  Administered 2018-09-27: 10 mg via INTRAVENOUS

## 2018-09-27 MED ORDER — FENTANYL CITRATE (PF) 100 MCG/2ML IJ SOLN
INTRAMUSCULAR | Status: DC | PRN
  Administered 2018-09-27 (×2): 50 ug via INTRAVENOUS

## 2018-09-27 NOTE — Anesthesia Preprocedure Evaluation (Addendum)
Anesthesia Evaluation  Patient identified by MRN, date of birth, ID band Patient awake    Reviewed: Allergy & Precautions, NPO status , Patient's Chart, lab work & pertinent test results  Airway Mallampati: II  TM Distance: >3 FB Neck ROM: Full    Dental  (+) Dental Advisory Given, Edentulous Lower, Edentulous Upper, Upper Dentures, Lower Dentures   Pulmonary neg pulmonary ROS,    Pulmonary exam normal breath sounds clear to auscultation       Cardiovascular hypertension, negative cardio ROS Normal cardiovascular exam Rhythm:Regular Rate:Normal     Neuro/Psych PSYCHIATRIC DISORDERS Dementia negative neurological ROS     GI/Hepatic Neg liver ROS, hiatal hernia, GERD  ,  Endo/Other  negative endocrine ROSdiabetes  Renal/GU Renal disease     Musculoskeletal  (+) Arthritis ,   Abdominal   Peds  Hematology negative hematology ROS (+) anemia ,   Anesthesia Other Findings   Reproductive/Obstetrics negative OB ROS                            Anesthesia Physical Anesthesia Plan  ASA: III  Anesthesia Plan: General   Post-op Pain Management:    Induction: Intravenous  PONV Risk Score and Plan: 3 and Ondansetron, Dexamethasone and Treatment may vary due to age or medical condition  Airway Management Planned: Oral ETT  Additional Equipment: None  Intra-op Plan:   Post-operative Plan: Extubation in OR  Informed Consent: I have reviewed the patients History and Physical, chart, labs and discussed the procedure including the risks, benefits and alternatives for the proposed anesthesia with the patient or authorized representative who has indicated his/her understanding and acceptance.     Dental advisory given  Plan Discussed with: CRNA  Anesthesia Plan Comments:         Anesthesia Quick Evaluation

## 2018-09-27 NOTE — Anesthesia Postprocedure Evaluation (Signed)
Anesthesia Post Note  Patient: Alejandra Espinoza  Procedure(s) Performed: ENDOSCOPIC RETROGRADE CHOLANGIOPANCREATOGRAPHY (ERCP) WITH PROPOFOL (N/A ) SPHINCTEROTOMY REMOVAL OF STONES     Patient location during evaluation: PACU Anesthesia Type: General Level of consciousness: awake and alert Pain management: pain level controlled Vital Signs Assessment: post-procedure vital signs reviewed and stable Respiratory status: spontaneous breathing, nonlabored ventilation, respiratory function stable and patient connected to nasal cannula oxygen Cardiovascular status: blood pressure returned to baseline and stable Postop Assessment: no apparent nausea or vomiting Anesthetic complications: no    Last Vitals:  Vitals:   09/27/18 1415 09/27/18 1416  BP: (!) 147/67   Pulse: 97   Resp: 14   Temp:  36.9 C  SpO2: 100%     Last Pain:  Vitals:   09/27/18 1416  TempSrc: Oral  PainSc:                  Zell Doucette

## 2018-09-27 NOTE — Transfer of Care (Signed)
Immediate Anesthesia Transfer of Care Note  Patient: Alejandra Espinoza  Procedure(s) Performed: ENDOSCOPIC RETROGRADE CHOLANGIOPANCREATOGRAPHY (ERCP) WITH PROPOFOL (N/A ) SPHINCTEROTOMY REMOVAL OF STONES  Patient Location: PACU and Endoscopy Unit  Anesthesia Type:General  Level of Consciousness: awake and alert   Airway & Oxygen Therapy: Patient Spontanous Breathing and Patient connected to face mask oxygen  Post-op Assessment: Report given to RN and Post -op Vital signs reviewed and stable  Post vital signs: Reviewed and stable  Last Vitals:  Vitals Value Taken Time  BP    Temp    Pulse    Resp    SpO2      Last Pain:  Vitals:   09/27/18 1155  TempSrc: Temporal  PainSc: 0-No pain         Complications: No apparent anesthesia complications

## 2018-09-27 NOTE — Discharge Instructions (Signed)
YOU HAD AN ENDOSCOPIC PROCEDURE TODAY: Refer to the procedure report and other information in the discharge instructions given to you for any specific questions about what was found during the examination. If this information does not answer your questions, please call [place] office at [phone] to clarify.  ° °YOU SHOULD EXPECT: Some feelings of bloating in the abdomen. Passage of more gas than usual. Walking can help get rid of the air that was put into your GI tract during the procedure and reduce the bloating. If you had a lower endoscopy (such as a colonoscopy or flexible sigmoidoscopy) you may notice spotting of blood in your stool or on the toilet paper. Some abdominal soreness may be present for a day or two, also. ° °DIET: Your first meal following the procedure should be a light meal and then it is ok to progress to your normal diet. A half-sandwich or bowl of soup is an example of a good first meal. Heavy or fried foods are harder to digest and may make you feel nauseous or bloated. Drink plenty of fluids but you should avoid alcoholic beverages for 24 hours. If you had a esophageal dilation, please see attached instructions for diet.   ° °ACTIVITY: Your care partner should take you home directly after the procedure. You should plan to take it easy, moving slowly for the rest of the day. You can resume normal activity the day after the procedure however YOU SHOULD NOT DRIVE, use power tools, machinery or perform tasks that involve climbing or major physical exertion for 24 hours (because of the sedation medicines used during the test).  ° °SYMPTOMS TO REPORT IMMEDIATELY: °A gastroenterologist can be reached at any hour. Please call [phone]  for any of the following symptoms:  °Following lower endoscopy (colonoscopy, flexible sigmoidoscopy) °Excessive amounts of blood in the stool  °Significant tenderness, worsening of abdominal pains  °Swelling of the abdomen that is new, acute  °Fever of 100° or  higher  °Following upper endoscopy (EGD, EUS, ERCP, esophageal dilation) °Vomiting of blood or coffee ground material  °New, significant abdominal pain  °New, significant chest pain or pain under the shoulder blades  °Painful or persistently difficult swallowing  °New shortness of breath  °Black, tarry-looking or red, bloody stools ° °FOLLOW UP:  °If any biopsies were taken you will be contacted by phone or by letter within the next 1-3 weeks. Call [phone]  if you have not heard about the biopsies in 3 weeks.  °Please also call with any specific questions about appointments or follow up tests. ° °

## 2018-09-27 NOTE — Anesthesia Procedure Notes (Signed)
Procedure Name: Intubation Date/Time: 09/27/2018 1:14 PM Performed by: Cynda Familia, CRNA Pre-anesthesia Checklist: Patient identified, Emergency Drugs available, Suction available and Patient being monitored Patient Re-evaluated:Patient Re-evaluated prior to induction Oxygen Delivery Method: Circle System Utilized Preoxygenation: Pre-oxygenation with 100% oxygen Induction Type: IV induction Ventilation: Mask ventilation without difficulty Laryngoscope Size: Miller and 2 Grade View: Grade I Tube type: Oral Tube size: 7.0 mm Number of attempts: 1 Airway Equipment and Method: Stylet Placement Confirmation: ETT inserted through vocal cords under direct vision,  positive ETCO2 and breath sounds checked- equal and bilateral Secured at: 22 cm Tube secured with: Tape Dental Injury: Teeth and Oropharynx as per pre-operative assessment  Comments: Smooth IV induction Oddono- intubation AM CRNA atraumatic-- mouth as preop no teeth-- bilat BS Oddono

## 2018-09-27 NOTE — Interval H&P Note (Signed)
History and Physical Interval Note:  09/27/2018 12:31 PM  Alejandra Espinoza  has presented today for surgery, with the diagnosis of CBD stone.  The various methods of treatment have been discussed with the patient and family. After consideration of risks, benefits and other options for treatment, the patient has consented to  Procedure(s): ENDOSCOPIC RETROGRADE CHOLANGIOPANCREATOGRAPHY (ERCP) WITH PROPOFOL (N/A) as a surgical intervention.  The patient's history has been reviewed, patient examined, no change in status, stable for surgery.  I have reviewed the patient's chart and labs.  Questions were answered to the patient's satisfaction.     Milus Banister

## 2018-09-27 NOTE — Anesthesia Procedure Notes (Signed)
Date/Time: 09/27/2018 2:06 PM Performed by: Cynda Familia, CRNA Oxygen Delivery Method: Simple face mask Placement Confirmation: positive ETCO2 and breath sounds checked- equal and bilateral

## 2018-09-27 NOTE — Op Note (Signed)
[facility] Patient Name: Alejandra MoritaLinda Albro Procedure Date: 09/27/2018 MRN: [id number] Attending MD: Rachael Feeaniel P  , MD Date of Birth: 1940/08/16 CSN: [id number] Age: 78 Admit Type: Outpatient Procedure:                ERCP Indications:              Abd pain and elevated liver tests led to imaging.                            CT scan showed CBD stones, no masses Providers:                Rachael Feeaniel P. , MD, Margaree MackintoshHayleigh Westmoreland, RN,                            Matthew FolksBrooke Cummings, Technician Referring MD:             Amada JupiterHenry Danis, MD Medicines:                Monitored Anesthesia Care, cipro 400mg  IV,                            indomethacin 100mg  PR Complications:            No immediate complications. Estimated blood loss:                            None Estimated Blood Loss:     Estimated blood loss: none. Procedure:                Pre-Anesthesia Assessment:                           - Prior to the procedure, a History and Physical                            was performed, and patient medications and                            allergies were reviewed. The patient's tolerance of                            previous anesthesia was also reviewed. The risks                            and benefits of the procedure and the sedation                            options and risks were discussed with the patient.                            All questions were answered, and informed consent                            was obtained. Prior Anticoagulants: The patient has  taken no previous anticoagulant or antiplatelet                            agents. ASA Grade Assessment: II - A patient with                            mild systemic disease. After reviewing the risks                            and benefits, the patient was deemed in                            satisfactory condition to undergo the procedure.                           After obtaining informed  consent, the scope was                            passed under direct vision. Throughout the                            procedure, the patient's blood pressure, pulse, and                            oxygen saturations were monitored continuously. The                            [id number]-[id number] ([id number] Olympus duodenoscope was                            introduced through the mouth, and used to inject                            contrast into and used to inject contrast into the                            bile duct. The ERCP was accomplished without                            difficulty. The patient tolerated the procedure                            well. Scope In: Scope Out: Findings:      A scout film of the abdomen was obtained. Surgical clips, consistent       with a previous cholecystectomy, were seen in the area of the right       upper quadrant of the abdomen. The duodenoscope was advanced to the       region of a redundant appearing major papilla. A 44 Autotome over a .035       hydrawire was used to cannulate the bile duct and contrast was injected.       Cholangiogram revealed two medium sized mobile filling defects (6-588mm       each), the biliary tree with diffusely dilated (CBD 12mm) and tapered  smoothly into the major papilla without obvious stricture. I performed       an adequate biliary sphincterotomy over the wire and then swept the duct       several times. This resulted in delivery of copious soft brown       stone/biodebris into the duodenum. There was no purulence. Completion,       occlusion cholangiogram showed no retained filling defects. The main       pancreatic duct was cannulated with the wire but never injected with dye. Impression:               - Remote cholecystectomy                           - Choledocholithiasis (soft brown stone/biodebris).                            This was treated with biliary sphincerotomy and                            balloon  sweeping. Moderate Sedation:      Not Applicable - Patient had care per Anesthesia. Recommendation:           - Discharge patient to home (ambulatory).                           - Follow clinically. Procedure Code(s):        --- Professional ---                           [id number], Endoscopic retrograde                            cholangiopancreatography (ERCP); with                            sphincterotomy/papillotomy Diagnosis Code(s):        --- Professional ---                           K80.50, Calculus of bile duct without cholangitis                            or cholecystitis without obstruction CPT copyright 2019 [employer] Medical Association. All rights reserved. The codes documented in this report are preliminary and upon coder review may  be revised to meet current compliance requirements. Milus Banister, MD 09/27/2018 2:07:24 PM This report has been signed electronically. Number of Addenda: 0

## 2018-09-28 ENCOUNTER — Encounter (HOSPITAL_COMMUNITY): Payer: Self-pay | Admitting: Gastroenterology

## 2019-07-25 IMAGING — RF ERCP
1 series · 8 of 8 positions shown · non-contrast
Comparison: CT abdomen and pelvis - 09/17/2018

CLINICAL DATA: Common bile duct stone.

EXAM:
ERCP
TECHNIQUE: Multiple spot images obtained with the fluoroscopic device and
submitted for interpretation post-procedure.
FLUOROSCOPY TIME:  6 minutes, 42 seconds

[Series 1: run · 2 acquisitions, 8 frames shown]
[im 1/2]
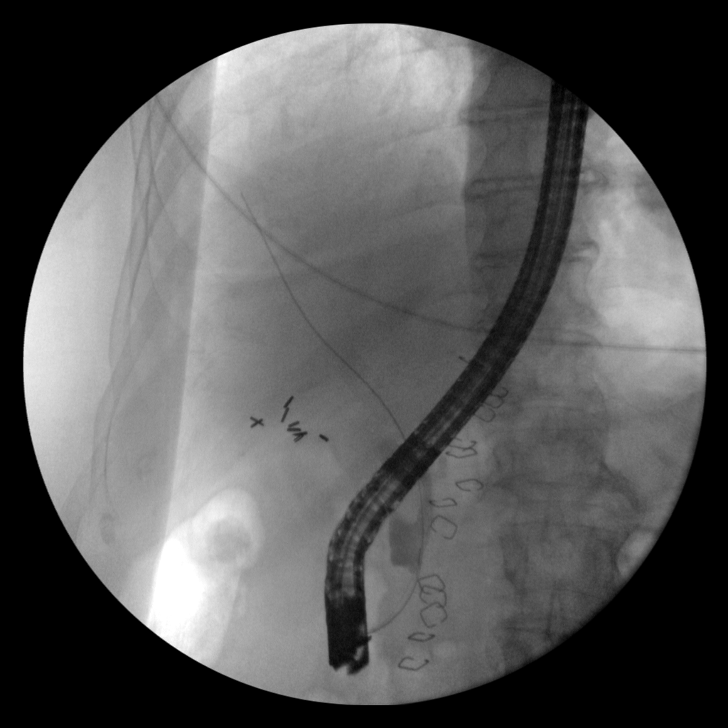
[im 1/2]
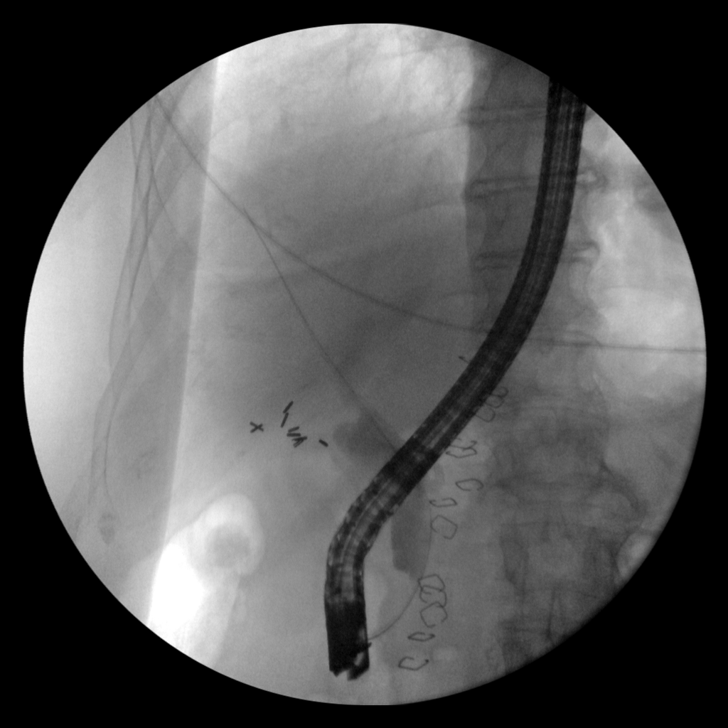
[im 1/2]
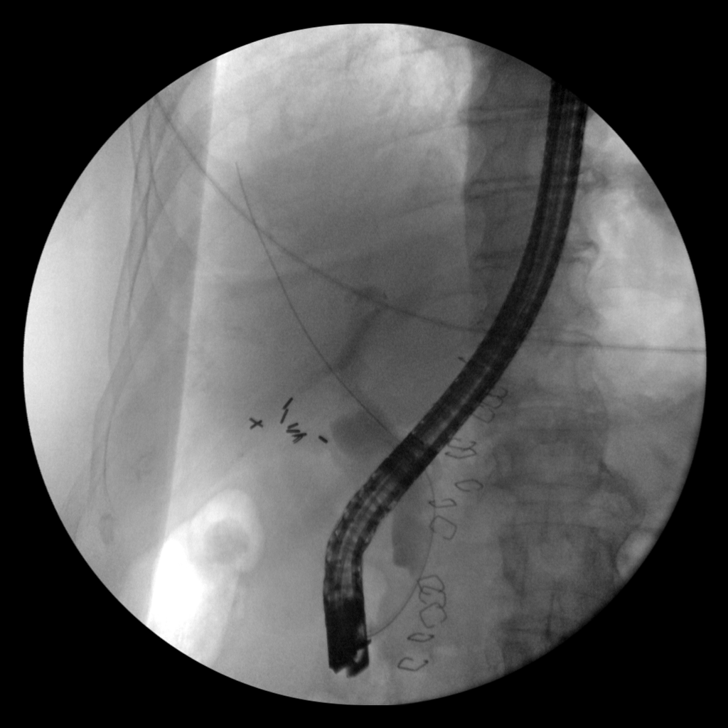
[im 1/2]
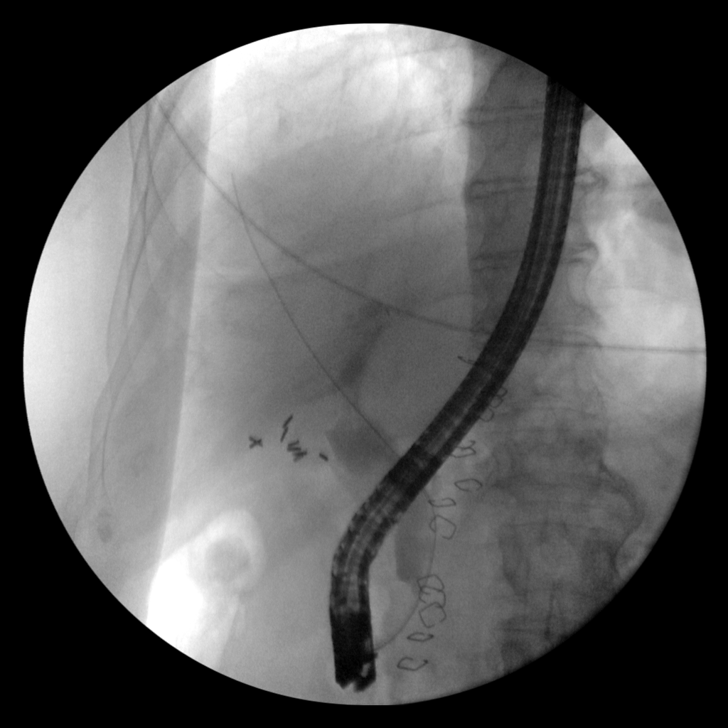
[im 2/2]
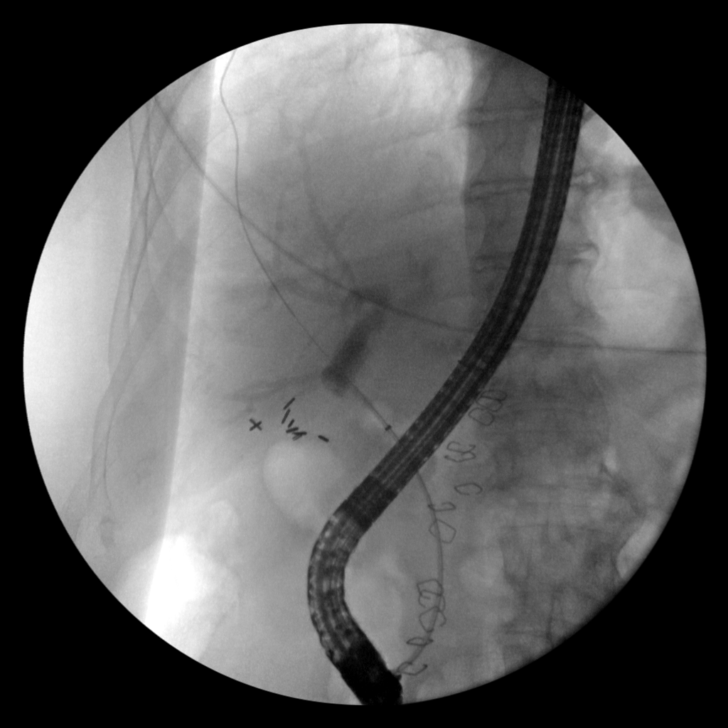
[im 2/2]
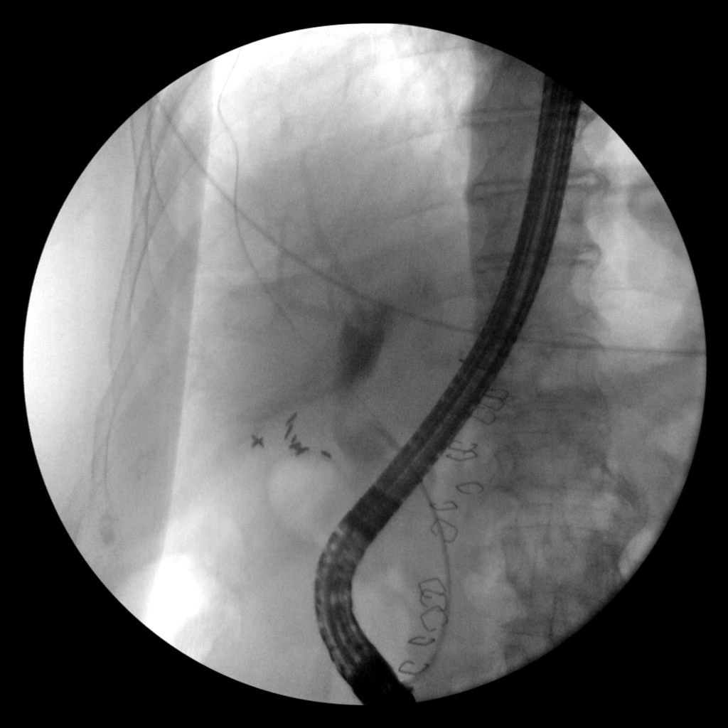
[im 2/2]
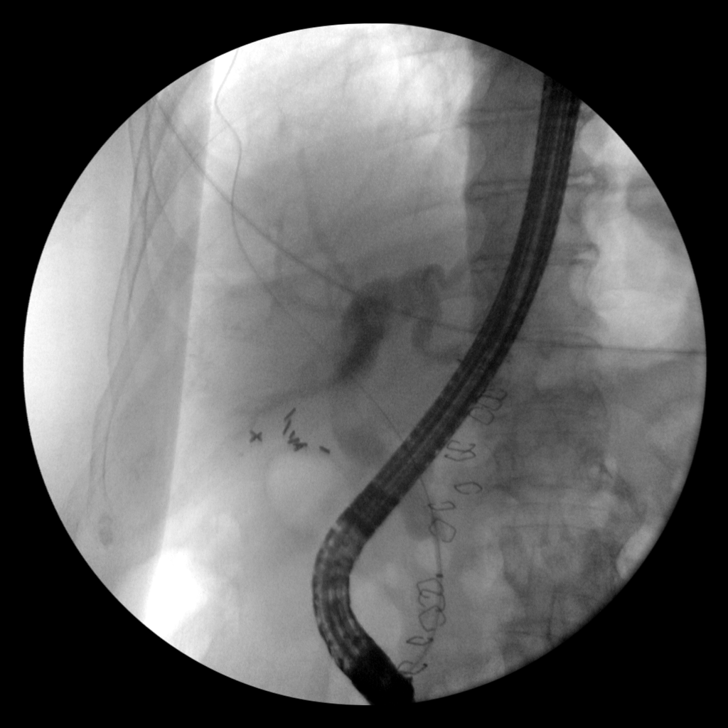
[im 2/2]
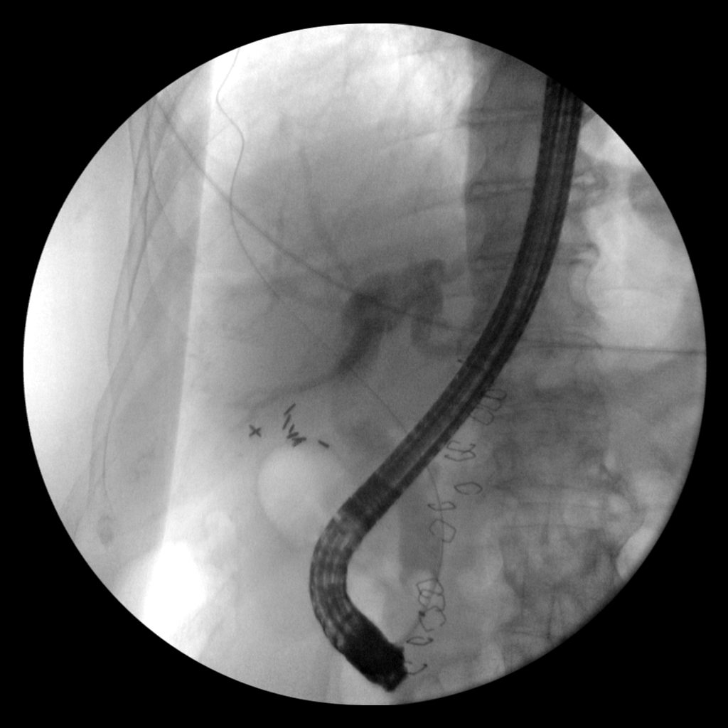

[8 of 8 positions shown; findings below may reference images not displayed]

FINDINGS: 3 intraoperative fluoroscopic sequences of the right upper abdominal
quadrant during ERCP are provided for review.

Initial image demonstrates an ERCP probe overlying the right upper
abdominal quadrant. Cholecystectomy clips overlies the expected
location of the gallbladder fossa. Midline metallic skin staples.

Subsequent images demonstrate selective cannulation opacification of
the common bile duct which appears dilated.

Subsequent images demonstrate further opacification of the common
bile duct with apparent mobile filling defects suggestive of
provided history of choledocholithiasis.

Completion sequence demonstrates insufflation of a balloon within
the central aspect of the CBD with subsequent sweeping and presumed
biliary sphincterotomy.
IMPRESSION: ERCP with biliary sweeping and presumed sphincterotomy.

These images were submitted for radiologic interpretation only.
Please see the procedural report for the amount of contrast and the
fluoroscopy time utilized.

## 2019-11-25 DIAGNOSIS — E1121 Type 2 diabetes mellitus with diabetic nephropathy: Secondary | ICD-10-CM | POA: Diagnosis not present

## 2019-11-25 DIAGNOSIS — M0589 Other rheumatoid arthritis with rheumatoid factor of multiple sites: Secondary | ICD-10-CM | POA: Diagnosis not present

## 2019-11-25 DIAGNOSIS — E119 Type 2 diabetes mellitus without complications: Secondary | ICD-10-CM | POA: Diagnosis not present

## 2019-11-25 DIAGNOSIS — E0789 Other specified disorders of thyroid: Secondary | ICD-10-CM | POA: Diagnosis not present

## 2019-11-25 DIAGNOSIS — Z8639 Personal history of other endocrine, nutritional and metabolic disease: Secondary | ICD-10-CM | POA: Diagnosis not present

## 2019-11-25 DIAGNOSIS — N184 Chronic kidney disease, stage 4 (severe): Secondary | ICD-10-CM | POA: Diagnosis not present

## 2019-11-25 DIAGNOSIS — E118 Type 2 diabetes mellitus with unspecified complications: Secondary | ICD-10-CM | POA: Diagnosis not present

## 2020-02-24 DIAGNOSIS — K219 Gastro-esophageal reflux disease without esophagitis: Secondary | ICD-10-CM | POA: Diagnosis not present

## 2020-02-24 DIAGNOSIS — E559 Vitamin D deficiency, unspecified: Secondary | ICD-10-CM | POA: Diagnosis not present

## 2020-02-24 DIAGNOSIS — I10 Essential (primary) hypertension: Secondary | ICD-10-CM | POA: Diagnosis not present

## 2020-03-11 DIAGNOSIS — G20 Parkinson's disease: Secondary | ICD-10-CM | POA: Diagnosis not present

## 2020-03-11 DIAGNOSIS — G8929 Other chronic pain: Secondary | ICD-10-CM | POA: Diagnosis not present

## 2020-03-11 DIAGNOSIS — N289 Disorder of kidney and ureter, unspecified: Secondary | ICD-10-CM | POA: Diagnosis not present

## 2020-03-11 DIAGNOSIS — M0589 Other rheumatoid arthritis with rheumatoid factor of multiple sites: Secondary | ICD-10-CM | POA: Diagnosis not present

## 2020-03-11 DIAGNOSIS — M150 Primary generalized (osteo)arthritis: Secondary | ICD-10-CM | POA: Diagnosis not present

## 2020-03-11 DIAGNOSIS — R7989 Other specified abnormal findings of blood chemistry: Secondary | ICD-10-CM | POA: Diagnosis not present

## 2020-03-11 DIAGNOSIS — Z79899 Other long term (current) drug therapy: Secondary | ICD-10-CM | POA: Diagnosis not present

## 2020-05-20 DIAGNOSIS — Z8639 Personal history of other endocrine, nutritional and metabolic disease: Secondary | ICD-10-CM | POA: Diagnosis not present

## 2020-05-20 DIAGNOSIS — E118 Type 2 diabetes mellitus with unspecified complications: Secondary | ICD-10-CM | POA: Diagnosis not present

## 2020-05-20 DIAGNOSIS — N184 Chronic kidney disease, stage 4 (severe): Secondary | ICD-10-CM | POA: Diagnosis not present

## 2020-05-27 DIAGNOSIS — N2581 Secondary hyperparathyroidism of renal origin: Secondary | ICD-10-CM | POA: Diagnosis not present

## 2020-05-27 DIAGNOSIS — E0590 Thyrotoxicosis, unspecified without thyrotoxic crisis or storm: Secondary | ICD-10-CM | POA: Diagnosis not present

## 2020-05-27 DIAGNOSIS — E119 Type 2 diabetes mellitus without complications: Secondary | ICD-10-CM | POA: Diagnosis not present

## 2020-05-27 DIAGNOSIS — E1121 Type 2 diabetes mellitus with diabetic nephropathy: Secondary | ICD-10-CM | POA: Diagnosis not present

## 2020-05-27 DIAGNOSIS — M0589 Other rheumatoid arthritis with rheumatoid factor of multiple sites: Secondary | ICD-10-CM | POA: Diagnosis not present

## 2020-09-09 DIAGNOSIS — Z79899 Other long term (current) drug therapy: Secondary | ICD-10-CM | POA: Diagnosis not present

## 2020-09-09 DIAGNOSIS — M0589 Other rheumatoid arthritis with rheumatoid factor of multiple sites: Secondary | ICD-10-CM | POA: Diagnosis not present

## 2021-02-24 DIAGNOSIS — Z79899 Other long term (current) drug therapy: Secondary | ICD-10-CM | POA: Diagnosis not present

## 2021-02-24 DIAGNOSIS — M0589 Other rheumatoid arthritis with rheumatoid factor of multiple sites: Secondary | ICD-10-CM | POA: Diagnosis not present

## 2021-03-03 DIAGNOSIS — N184 Chronic kidney disease, stage 4 (severe): Secondary | ICD-10-CM | POA: Diagnosis not present

## 2021-03-03 DIAGNOSIS — Z794 Long term (current) use of insulin: Secondary | ICD-10-CM | POA: Diagnosis not present

## 2021-03-03 DIAGNOSIS — E559 Vitamin D deficiency, unspecified: Secondary | ICD-10-CM | POA: Diagnosis not present

## 2021-03-03 DIAGNOSIS — Z0000 Encounter for general adult medical examination without abnormal findings: Secondary | ICD-10-CM | POA: Diagnosis not present

## 2021-03-03 DIAGNOSIS — I10 Essential (primary) hypertension: Secondary | ICD-10-CM | POA: Diagnosis not present

## 2021-03-03 DIAGNOSIS — E118 Type 2 diabetes mellitus with unspecified complications: Secondary | ICD-10-CM | POA: Diagnosis not present

## 2021-03-03 DIAGNOSIS — K219 Gastro-esophageal reflux disease without esophagitis: Secondary | ICD-10-CM | POA: Diagnosis not present

## 2021-03-03 DIAGNOSIS — L304 Erythema intertrigo: Secondary | ICD-10-CM | POA: Diagnosis not present

## 2021-03-03 DIAGNOSIS — Z8639 Personal history of other endocrine, nutritional and metabolic disease: Secondary | ICD-10-CM | POA: Diagnosis not present

## 2021-03-10 DIAGNOSIS — E118 Type 2 diabetes mellitus with unspecified complications: Secondary | ICD-10-CM | POA: Diagnosis not present

## 2021-03-10 DIAGNOSIS — Z79899 Other long term (current) drug therapy: Secondary | ICD-10-CM | POA: Diagnosis not present

## 2021-03-10 DIAGNOSIS — N289 Disorder of kidney and ureter, unspecified: Secondary | ICD-10-CM | POA: Diagnosis not present

## 2021-03-10 DIAGNOSIS — G8929 Other chronic pain: Secondary | ICD-10-CM | POA: Diagnosis not present

## 2021-03-10 DIAGNOSIS — M79643 Pain in unspecified hand: Secondary | ICD-10-CM | POA: Diagnosis not present

## 2021-03-10 DIAGNOSIS — M150 Primary generalized (osteo)arthritis: Secondary | ICD-10-CM | POA: Diagnosis not present

## 2021-03-10 DIAGNOSIS — G20 Parkinson's disease: Secondary | ICD-10-CM | POA: Diagnosis not present

## 2021-03-10 DIAGNOSIS — M0589 Other rheumatoid arthritis with rheumatoid factor of multiple sites: Secondary | ICD-10-CM | POA: Diagnosis not present

## 2021-03-10 DIAGNOSIS — N184 Chronic kidney disease, stage 4 (severe): Secondary | ICD-10-CM | POA: Diagnosis not present

## 2021-03-10 DIAGNOSIS — Z0000 Encounter for general adult medical examination without abnormal findings: Secondary | ICD-10-CM | POA: Diagnosis not present

## 2021-05-12 DIAGNOSIS — M0589 Other rheumatoid arthritis with rheumatoid factor of multiple sites: Secondary | ICD-10-CM | POA: Diagnosis not present

## 2021-06-02 DIAGNOSIS — N184 Chronic kidney disease, stage 4 (severe): Secondary | ICD-10-CM | POA: Diagnosis not present

## 2021-06-02 DIAGNOSIS — E1121 Type 2 diabetes mellitus with diabetic nephropathy: Secondary | ICD-10-CM | POA: Diagnosis not present

## 2021-06-02 DIAGNOSIS — E559 Vitamin D deficiency, unspecified: Secondary | ICD-10-CM | POA: Diagnosis not present

## 2021-06-02 DIAGNOSIS — I10 Essential (primary) hypertension: Secondary | ICD-10-CM | POA: Diagnosis not present

## 2021-08-25 DIAGNOSIS — M0589 Other rheumatoid arthritis with rheumatoid factor of multiple sites: Secondary | ICD-10-CM | POA: Diagnosis not present

## 2021-08-25 DIAGNOSIS — G8929 Other chronic pain: Secondary | ICD-10-CM | POA: Diagnosis not present

## 2021-08-25 DIAGNOSIS — N289 Disorder of kidney and ureter, unspecified: Secondary | ICD-10-CM | POA: Diagnosis not present

## 2021-08-25 DIAGNOSIS — G20 Parkinson's disease: Secondary | ICD-10-CM | POA: Diagnosis not present

## 2021-08-25 DIAGNOSIS — M150 Primary generalized (osteo)arthritis: Secondary | ICD-10-CM | POA: Diagnosis not present

## 2021-08-25 DIAGNOSIS — Z79899 Other long term (current) drug therapy: Secondary | ICD-10-CM | POA: Diagnosis not present

## 2021-10-06 DIAGNOSIS — E1121 Type 2 diabetes mellitus with diabetic nephropathy: Secondary | ICD-10-CM | POA: Diagnosis not present

## 2021-10-06 DIAGNOSIS — E559 Vitamin D deficiency, unspecified: Secondary | ICD-10-CM | POA: Diagnosis not present

## 2021-10-13 DIAGNOSIS — N184 Chronic kidney disease, stage 4 (severe): Secondary | ICD-10-CM | POA: Diagnosis not present

## 2021-10-13 DIAGNOSIS — E1165 Type 2 diabetes mellitus with hyperglycemia: Secondary | ICD-10-CM | POA: Diagnosis not present

## 2021-10-13 DIAGNOSIS — I10 Essential (primary) hypertension: Secondary | ICD-10-CM | POA: Diagnosis not present

## 2021-10-13 DIAGNOSIS — E559 Vitamin D deficiency, unspecified: Secondary | ICD-10-CM | POA: Diagnosis not present

## 2021-11-18 ENCOUNTER — Ambulatory Visit: Payer: Self-pay

## 2021-11-18 NOTE — Patient Outreach (Signed)
  Care Coordination   11/18/2021 Name: Alejandra Espinoza MRN: [id number] DOB: Mar 26, 1941   Care Coordination Outreach Attempts:  An unsuccessful telephone outreach was attempted today to offer the patient information about available care coordination services as a benefit of their health plan.   Follow Up Plan:  Additional outreach attempts will be made to offer the patient care coordination information and services.   Encounter Outcome:  No Answer  Care Coordination Interventions Activated:  No   Care Coordination Interventions:  No, not indicated    Jodelle Gross, RN, BSN, CCM Care Management Coordinator [facility] Phone: [phone]/Fax: [phone]

## 2022-01-09 DIAGNOSIS — Z7984 Long term (current) use of oral hypoglycemic drugs: Secondary | ICD-10-CM | POA: Diagnosis not present

## 2022-01-09 DIAGNOSIS — E119 Type 2 diabetes mellitus without complications: Secondary | ICD-10-CM | POA: Diagnosis not present

## 2022-01-09 DIAGNOSIS — J3489 Other specified disorders of nose and nasal sinuses: Secondary | ICD-10-CM | POA: Diagnosis not present

## 2022-01-09 DIAGNOSIS — Z888 Allergy status to other drugs, medicaments and biological substances status: Secondary | ICD-10-CM | POA: Diagnosis not present

## 2022-01-09 DIAGNOSIS — H05012 Cellulitis of left orbit: Secondary | ICD-10-CM | POA: Diagnosis not present

## 2022-01-09 DIAGNOSIS — L03213 Periorbital cellulitis: Secondary | ICD-10-CM | POA: Diagnosis not present

## 2022-01-09 DIAGNOSIS — Z79899 Other long term (current) drug therapy: Secondary | ICD-10-CM | POA: Diagnosis not present

## 2022-01-09 DIAGNOSIS — M7989 Other specified soft tissue disorders: Secondary | ICD-10-CM | POA: Diagnosis not present

## 2022-01-09 DIAGNOSIS — L03211 Cellulitis of face: Secondary | ICD-10-CM | POA: Diagnosis not present

## 2022-01-12 DIAGNOSIS — L03213 Periorbital cellulitis: Secondary | ICD-10-CM | POA: Diagnosis not present

## 2022-01-12 DIAGNOSIS — E0590 Thyrotoxicosis, unspecified without thyrotoxic crisis or storm: Secondary | ICD-10-CM | POA: Diagnosis not present

## 2022-01-12 DIAGNOSIS — E1165 Type 2 diabetes mellitus with hyperglycemia: Secondary | ICD-10-CM | POA: Diagnosis not present

## 2022-01-12 DIAGNOSIS — N184 Chronic kidney disease, stage 4 (severe): Secondary | ICD-10-CM | POA: Diagnosis not present

## 2022-01-12 DIAGNOSIS — I10 Essential (primary) hypertension: Secondary | ICD-10-CM | POA: Diagnosis not present

## 2022-01-18 DIAGNOSIS — L03211 Cellulitis of face: Secondary | ICD-10-CM | POA: Diagnosis not present

## 2022-01-20 DIAGNOSIS — Z7985 Long-term (current) use of injectable non-insulin antidiabetic drugs: Secondary | ICD-10-CM | POA: Diagnosis not present

## 2022-01-20 DIAGNOSIS — Z79899 Other long term (current) drug therapy: Secondary | ICD-10-CM | POA: Diagnosis not present

## 2022-01-20 DIAGNOSIS — B029 Zoster without complications: Secondary | ICD-10-CM | POA: Diagnosis not present

## 2022-01-20 DIAGNOSIS — M1990 Unspecified osteoarthritis, unspecified site: Secondary | ICD-10-CM | POA: Diagnosis not present

## 2022-01-20 DIAGNOSIS — Z794 Long term (current) use of insulin: Secondary | ICD-10-CM | POA: Diagnosis not present

## 2022-01-20 DIAGNOSIS — E119 Type 2 diabetes mellitus without complications: Secondary | ICD-10-CM | POA: Diagnosis not present

## 2022-01-20 DIAGNOSIS — B028 Zoster with other complications: Secondary | ICD-10-CM | POA: Diagnosis not present

## 2022-01-26 DIAGNOSIS — B029 Zoster without complications: Secondary | ICD-10-CM | POA: Diagnosis not present

## 2022-01-26 DIAGNOSIS — R051 Acute cough: Secondary | ICD-10-CM | POA: Diagnosis not present

## 2022-01-26 DIAGNOSIS — K143 Hypertrophy of tongue papillae: Secondary | ICD-10-CM | POA: Diagnosis not present

## 2022-01-26 DIAGNOSIS — J45909 Unspecified asthma, uncomplicated: Secondary | ICD-10-CM | POA: Diagnosis not present

## 2022-02-01 DIAGNOSIS — K143 Hypertrophy of tongue papillae: Secondary | ICD-10-CM | POA: Diagnosis not present

## 2022-02-01 DIAGNOSIS — J45909 Unspecified asthma, uncomplicated: Secondary | ICD-10-CM | POA: Diagnosis not present

## 2022-02-01 DIAGNOSIS — B029 Zoster without complications: Secondary | ICD-10-CM | POA: Diagnosis not present

## 2022-02-09 DIAGNOSIS — E1165 Type 2 diabetes mellitus with hyperglycemia: Secondary | ICD-10-CM | POA: Diagnosis not present

## 2022-02-09 DIAGNOSIS — E0590 Thyrotoxicosis, unspecified without thyrotoxic crisis or storm: Secondary | ICD-10-CM | POA: Diagnosis not present

## 2022-02-09 DIAGNOSIS — E559 Vitamin D deficiency, unspecified: Secondary | ICD-10-CM | POA: Diagnosis not present

## 2022-02-09 DIAGNOSIS — Z0000 Encounter for general adult medical examination without abnormal findings: Secondary | ICD-10-CM | POA: Diagnosis not present

## 2022-03-01 DIAGNOSIS — M150 Primary generalized (osteo)arthritis: Secondary | ICD-10-CM | POA: Diagnosis not present

## 2022-03-01 DIAGNOSIS — G20C Parkinsonism, unspecified: Secondary | ICD-10-CM | POA: Diagnosis not present

## 2022-03-01 DIAGNOSIS — Z79899 Other long term (current) drug therapy: Secondary | ICD-10-CM | POA: Diagnosis not present

## 2022-03-01 DIAGNOSIS — N289 Disorder of kidney and ureter, unspecified: Secondary | ICD-10-CM | POA: Diagnosis not present

## 2022-03-01 DIAGNOSIS — G8929 Other chronic pain: Secondary | ICD-10-CM | POA: Diagnosis not present

## 2022-03-01 DIAGNOSIS — M0589 Other rheumatoid arthritis with rheumatoid factor of multiple sites: Secondary | ICD-10-CM | POA: Diagnosis not present

## 2022-03-09 ENCOUNTER — Telehealth: Payer: Self-pay

## 2022-03-09 DIAGNOSIS — E1121 Type 2 diabetes mellitus with diabetic nephropathy: Secondary | ICD-10-CM | POA: Diagnosis not present

## 2022-03-09 DIAGNOSIS — I10 Essential (primary) hypertension: Secondary | ICD-10-CM | POA: Diagnosis not present

## 2022-03-09 DIAGNOSIS — B0231 Zoster conjunctivitis: Secondary | ICD-10-CM | POA: Diagnosis not present

## 2022-03-09 DIAGNOSIS — G20C Parkinsonism, unspecified: Secondary | ICD-10-CM | POA: Diagnosis not present

## 2022-03-09 DIAGNOSIS — Z0000 Encounter for general adult medical examination without abnormal findings: Secondary | ICD-10-CM | POA: Diagnosis not present

## 2022-03-09 DIAGNOSIS — N184 Chronic kidney disease, stage 4 (severe): Secondary | ICD-10-CM | POA: Diagnosis not present

## 2022-03-09 DIAGNOSIS — E559 Vitamin D deficiency, unspecified: Secondary | ICD-10-CM | POA: Diagnosis not present

## 2022-03-09 NOTE — Patient Instructions (Signed)
Visit Information  Thank you for taking time to visit with me today. Please don't hesitate to contact me if I can be of assistance to you.   Following are the goals we discussed today:   Goals Addressed             This Visit's Progress    Care Coordination Activities-no follow up required       Care Coordination Interventions: Advised patient to [facility] services and support.  Patient decline.            If you are experiencing a Mental Health or Behavioral Health Crisis or need someone to talk to, please call the Suicide and Crisis Lifeline: 988   The patient verbalized understanding of instructions, educational materials, and care plan provided today and DECLINED offer to receive copy of patient instructions, educational materials, and care plan.   No further follow up required: decline  Bary Leriche, RN, MSN [facility] Care Management Care Management Coordinator Direct Line [phone]

## 2022-03-09 NOTE — Patient Outreach (Signed)
  Care Coordination   In Person Provider Office Visit Note   03/09/2022 Name: RENDI MAPEL MRN: [id number] DOB: 1941-01-20  ERZA MOTHERSHEAD is a 81 y.o. year old female who sees Larose Hires, Gaylyn Lambert, FNP for primary care. I engaged with Jewel Baize in the providers office today.  What matters to the patients health and wellness today?  none    Goals Addressed             This Visit's Progress    Care Coordination Activities-no follow up required       Care Coordination Interventions: Advised patient to [facility] services and support.  Patient decline.          SDOH assessments and interventions completed:  No     Care Coordination Interventions:  Yes, provided   Follow up plan: No further intervention required.   Encounter Outcome:  Pt. Visit Completed   Bary Leriche, RN, MSN [facility] Care Management Care Management Coordinator Direct Line [phone]

## 2022-05-18 DIAGNOSIS — G8929 Other chronic pain: Secondary | ICD-10-CM | POA: Diagnosis not present

## 2022-05-18 DIAGNOSIS — M150 Primary generalized (osteo)arthritis: Secondary | ICD-10-CM | POA: Diagnosis not present

## 2022-05-18 DIAGNOSIS — G20C Parkinsonism, unspecified: Secondary | ICD-10-CM | POA: Diagnosis not present

## 2022-05-18 DIAGNOSIS — Z79899 Other long term (current) drug therapy: Secondary | ICD-10-CM | POA: Diagnosis not present

## 2022-05-18 DIAGNOSIS — N289 Disorder of kidney and ureter, unspecified: Secondary | ICD-10-CM | POA: Diagnosis not present

## 2022-05-18 DIAGNOSIS — E1165 Type 2 diabetes mellitus with hyperglycemia: Secondary | ICD-10-CM | POA: Diagnosis not present

## 2022-05-18 DIAGNOSIS — E0590 Thyrotoxicosis, unspecified without thyrotoxic crisis or storm: Secondary | ICD-10-CM | POA: Diagnosis not present

## 2022-05-18 DIAGNOSIS — M0589 Other rheumatoid arthritis with rheumatoid factor of multiple sites: Secondary | ICD-10-CM | POA: Diagnosis not present

## 2022-06-01 DIAGNOSIS — N184 Chronic kidney disease, stage 4 (severe): Secondary | ICD-10-CM | POA: Diagnosis not present

## 2022-06-01 DIAGNOSIS — Z0000 Encounter for general adult medical examination without abnormal findings: Secondary | ICD-10-CM | POA: Diagnosis not present

## 2022-06-01 DIAGNOSIS — E1165 Type 2 diabetes mellitus with hyperglycemia: Secondary | ICD-10-CM | POA: Diagnosis not present

## 2022-06-01 DIAGNOSIS — I10 Essential (primary) hypertension: Secondary | ICD-10-CM | POA: Diagnosis not present

## 2022-06-01 DIAGNOSIS — E0590 Thyrotoxicosis, unspecified without thyrotoxic crisis or storm: Secondary | ICD-10-CM | POA: Diagnosis not present

## 2022-09-21 DIAGNOSIS — Z79899 Other long term (current) drug therapy: Secondary | ICD-10-CM | POA: Diagnosis not present

## 2022-09-21 DIAGNOSIS — E1165 Type 2 diabetes mellitus with hyperglycemia: Secondary | ICD-10-CM | POA: Diagnosis not present

## 2022-09-21 DIAGNOSIS — G20C Parkinsonism, unspecified: Secondary | ICD-10-CM | POA: Diagnosis not present

## 2022-09-21 DIAGNOSIS — G8929 Other chronic pain: Secondary | ICD-10-CM | POA: Diagnosis not present

## 2022-09-21 DIAGNOSIS — N289 Disorder of kidney and ureter, unspecified: Secondary | ICD-10-CM | POA: Diagnosis not present

## 2022-09-21 DIAGNOSIS — M150 Primary generalized (osteo)arthritis: Secondary | ICD-10-CM | POA: Diagnosis not present

## 2022-09-21 DIAGNOSIS — M0589 Other rheumatoid arthritis with rheumatoid factor of multiple sites: Secondary | ICD-10-CM | POA: Diagnosis not present

## 2022-10-19 DIAGNOSIS — E0590 Thyrotoxicosis, unspecified without thyrotoxic crisis or storm: Secondary | ICD-10-CM | POA: Diagnosis not present

## 2022-10-19 DIAGNOSIS — E1121 Type 2 diabetes mellitus with diabetic nephropathy: Secondary | ICD-10-CM | POA: Diagnosis not present

## 2022-10-19 DIAGNOSIS — E1165 Type 2 diabetes mellitus with hyperglycemia: Secondary | ICD-10-CM | POA: Diagnosis not present

## 2022-10-26 DIAGNOSIS — H04123 Dry eye syndrome of bilateral lacrimal glands: Secondary | ICD-10-CM | POA: Diagnosis not present

## 2022-10-26 DIAGNOSIS — E119 Type 2 diabetes mellitus without complications: Secondary | ICD-10-CM | POA: Diagnosis not present

## 2022-10-26 DIAGNOSIS — H26493 Other secondary cataract, bilateral: Secondary | ICD-10-CM | POA: Diagnosis not present

## 2022-10-26 DIAGNOSIS — H40023 Open angle with borderline findings, high risk, bilateral: Secondary | ICD-10-CM | POA: Diagnosis not present

## 2023-01-18 DIAGNOSIS — E1121 Type 2 diabetes mellitus with diabetic nephropathy: Secondary | ICD-10-CM | POA: Diagnosis not present

## 2023-03-15 DIAGNOSIS — E118 Type 2 diabetes mellitus with unspecified complications: Secondary | ICD-10-CM | POA: Diagnosis not present

## 2023-03-15 DIAGNOSIS — E789 Disorder of lipoprotein metabolism, unspecified: Secondary | ICD-10-CM | POA: Diagnosis not present

## 2023-03-15 DIAGNOSIS — E0590 Thyrotoxicosis, unspecified without thyrotoxic crisis or storm: Secondary | ICD-10-CM | POA: Diagnosis not present

## 2023-03-15 DIAGNOSIS — N390 Urinary tract infection, site not specified: Secondary | ICD-10-CM | POA: Diagnosis not present

## 2023-03-22 DIAGNOSIS — I10 Essential (primary) hypertension: Secondary | ICD-10-CM | POA: Diagnosis not present

## 2023-03-22 DIAGNOSIS — Z0000 Encounter for general adult medical examination without abnormal findings: Secondary | ICD-10-CM | POA: Diagnosis not present

## 2023-03-22 DIAGNOSIS — N189 Chronic kidney disease, unspecified: Secondary | ICD-10-CM | POA: Diagnosis not present

## 2023-03-22 DIAGNOSIS — E1121 Type 2 diabetes mellitus with diabetic nephropathy: Secondary | ICD-10-CM | POA: Diagnosis not present

## 2023-03-22 DIAGNOSIS — D631 Anemia in chronic kidney disease: Secondary | ICD-10-CM | POA: Diagnosis not present

## 2023-03-22 DIAGNOSIS — N390 Urinary tract infection, site not specified: Secondary | ICD-10-CM | POA: Diagnosis not present

## 2023-03-22 DIAGNOSIS — K219 Gastro-esophageal reflux disease without esophagitis: Secondary | ICD-10-CM | POA: Diagnosis not present

## 2023-05-03 DIAGNOSIS — I10 Essential (primary) hypertension: Secondary | ICD-10-CM | POA: Diagnosis not present

## 2023-05-03 DIAGNOSIS — E0590 Thyrotoxicosis, unspecified without thyrotoxic crisis or storm: Secondary | ICD-10-CM | POA: Diagnosis not present

## 2023-05-03 DIAGNOSIS — N184 Chronic kidney disease, stage 4 (severe): Secondary | ICD-10-CM | POA: Diagnosis not present

## 2023-05-03 DIAGNOSIS — E1121 Type 2 diabetes mellitus with diabetic nephropathy: Secondary | ICD-10-CM | POA: Diagnosis not present

## 2023-09-13 DIAGNOSIS — E1121 Type 2 diabetes mellitus with diabetic nephropathy: Secondary | ICD-10-CM | POA: Diagnosis not present

## 2023-09-20 DIAGNOSIS — Z79899 Other long term (current) drug therapy: Secondary | ICD-10-CM | POA: Diagnosis not present

## 2023-09-27 DIAGNOSIS — I10 Essential (primary) hypertension: Secondary | ICD-10-CM | POA: Diagnosis not present

## 2023-09-27 DIAGNOSIS — D631 Anemia in chronic kidney disease: Secondary | ICD-10-CM | POA: Diagnosis not present

## 2023-09-27 DIAGNOSIS — E559 Vitamin D deficiency, unspecified: Secondary | ICD-10-CM | POA: Diagnosis not present

## 2023-09-27 DIAGNOSIS — K219 Gastro-esophageal reflux disease without esophagitis: Secondary | ICD-10-CM | POA: Diagnosis not present

## 2023-09-27 DIAGNOSIS — E1121 Type 2 diabetes mellitus with diabetic nephropathy: Secondary | ICD-10-CM | POA: Diagnosis not present

## 2023-09-27 DIAGNOSIS — N184 Chronic kidney disease, stage 4 (severe): Secondary | ICD-10-CM | POA: Diagnosis not present

## 2023-10-25 DIAGNOSIS — Z79899 Other long term (current) drug therapy: Secondary | ICD-10-CM | POA: Diagnosis not present

## 2023-10-25 DIAGNOSIS — M150 Primary generalized (osteo)arthritis: Secondary | ICD-10-CM | POA: Diagnosis not present

## 2023-10-25 DIAGNOSIS — M0589 Other rheumatoid arthritis with rheumatoid factor of multiple sites: Secondary | ICD-10-CM | POA: Diagnosis not present

## 2023-11-01 DIAGNOSIS — I10 Essential (primary) hypertension: Secondary | ICD-10-CM | POA: Diagnosis not present

## 2023-11-01 DIAGNOSIS — N184 Chronic kidney disease, stage 4 (severe): Secondary | ICD-10-CM | POA: Diagnosis not present

## 2023-11-01 DIAGNOSIS — E1121 Type 2 diabetes mellitus with diabetic nephropathy: Secondary | ICD-10-CM | POA: Diagnosis not present

## 2023-11-01 DIAGNOSIS — E0590 Thyrotoxicosis, unspecified without thyrotoxic crisis or storm: Secondary | ICD-10-CM | POA: Diagnosis not present

## 2023-11-20 DIAGNOSIS — N8110 Cystocele, unspecified: Secondary | ICD-10-CM | POA: Diagnosis not present

## 2024-01-24 DIAGNOSIS — M0589 Other rheumatoid arthritis with rheumatoid factor of multiple sites: Secondary | ICD-10-CM | POA: Diagnosis not present
